# Patient Record
Sex: Female | Born: 1980 | Race: Black or African American | Hispanic: No | Marital: Single | Smoking: Never smoker
Health system: Southern US, Community
[De-identification: ages and names within clinical notes are randomized; demographics above are authoritative.]

## PROBLEM LIST (undated history)

## (undated) DIAGNOSIS — E063 Autoimmune thyroiditis: Secondary | ICD-10-CM

## (undated) DIAGNOSIS — K219 Gastro-esophageal reflux disease without esophagitis: Secondary | ICD-10-CM

## (undated) DIAGNOSIS — R7303 Prediabetes: Secondary | ICD-10-CM

## (undated) DIAGNOSIS — E669 Obesity, unspecified: Secondary | ICD-10-CM

## (undated) HISTORY — DX: Gastro-esophageal reflux disease without esophagitis: K21.9

## (undated) HISTORY — DX: Autoimmune thyroiditis: E06.3

## (undated) HISTORY — DX: Prediabetes: R73.03

## (undated) HISTORY — DX: Obesity, unspecified: E66.9

---

## 1999-02-09 ENCOUNTER — Encounter: Admission: RE | Admit: 1999-02-09 | Discharge: 1999-02-09 | Payer: Self-pay | Admitting: Orthopedic Surgery

## 2001-01-18 ENCOUNTER — Emergency Department (HOSPITAL_COMMUNITY): Admission: EM | Admit: 2001-01-18 | Discharge: 2001-01-18 | Payer: Self-pay | Admitting: Emergency Medicine

## 2002-05-21 ENCOUNTER — Ambulatory Visit (HOSPITAL_COMMUNITY): Admission: RE | Admit: 2002-05-21 | Discharge: 2002-05-21 | Payer: Self-pay | Admitting: Obstetrics and Gynecology

## 2002-05-21 ENCOUNTER — Encounter: Payer: Self-pay | Admitting: Obstetrics and Gynecology

## 2002-09-05 ENCOUNTER — Ambulatory Visit (HOSPITAL_COMMUNITY): Admission: RE | Admit: 2002-09-05 | Discharge: 2002-09-05 | Payer: Self-pay | Admitting: Gastroenterology

## 2005-05-24 ENCOUNTER — Other Ambulatory Visit: Admission: RE | Admit: 2005-05-24 | Discharge: 2005-05-24 | Payer: Self-pay | Admitting: Obstetrics and Gynecology

## 2006-07-25 ENCOUNTER — Inpatient Hospital Stay (HOSPITAL_COMMUNITY): Admission: AD | Admit: 2006-07-25 | Discharge: 2006-07-25 | Payer: Self-pay | Admitting: Obstetrics and Gynecology

## 2007-04-13 HISTORY — PX: CHOLECYSTECTOMY: SHX55

## 2007-08-02 ENCOUNTER — Emergency Department (HOSPITAL_COMMUNITY): Admission: EM | Admit: 2007-08-02 | Discharge: 2007-08-02 | Payer: Self-pay | Admitting: Emergency Medicine

## 2007-09-06 ENCOUNTER — Encounter: Admission: RE | Admit: 2007-09-06 | Discharge: 2007-09-06 | Payer: Self-pay | Admitting: Family Medicine

## 2007-10-30 ENCOUNTER — Ambulatory Visit (HOSPITAL_COMMUNITY): Admission: RE | Admit: 2007-10-30 | Discharge: 2007-10-30 | Payer: Self-pay | Admitting: General Surgery

## 2007-10-30 ENCOUNTER — Encounter (INDEPENDENT_AMBULATORY_CARE_PROVIDER_SITE_OTHER): Payer: Self-pay | Admitting: General Surgery

## 2010-04-12 HISTORY — PX: TONSILLECTOMY: SUR1361

## 2010-08-25 NOTE — Op Note (Signed)
Kristina Khan, Kristina Khan NO.:  [id number]   MEDICAL RECORD NO.:  [id number]          PATIENT TYPE:  AMB   LOCATION:  DAY                          FACILITY:  [facility]   PHYSICIAN:  Anselm Pancoast. Weatherly, M.D.DATE OF BIRTH:  01-06-1981   DATE OF PROCEDURE:  10/30/2007  DATE OF DISCHARGE:                               OPERATIVE REPORT   PREOPERATIVE DIAGNOSIS:  Symptomatic cholelithiasis.   POSTOPERATIVE DIAGNOSIS:  Symptomatic cholelithiasis.   PROCEDURE PERFORMED:  Laparoscopic cholecystectomy.   SURGEON:  Juanetta Gosling, MD   ASSISTANT:  Anselm Pancoast. Zachery Dakins, M.D.   ANESTHESIA:  General.   ANESTHETIST:  Joyce, CRNA.   ESTIMATED BLOOD LOSS:  Minimal.   FLUIDS:  600 mL normal saline.   SPECIMENS:  Gallbladder with contents to pathology.   COMPLICATIONS:  None.   DRAINS:  None.   INDICATIONS:  This is a 30 year old female who has had several months of  intermittent right upper quadrant pain associated with eating.  Also  associated with some intermittent nausea and vomiting.  She underwent an  ultrasound which showed gallstones.  She has no evidence of prior  jaundice and her LFTs were normal preoperatively.  We discussed a  laparoscopic cholecystectomy for symptomatic cholelithiasis.   PROCEDURE IN DETAIL:  After informed consent is obtained, the patient  was taken to the operating room where she was placed under general  anesthesia without complications.  She was administered antibiotics  intravenously prior to beginning the procedure.  Pneumatic stockings  were placed on her bilateral lower extremities during this procedure as  well.  Her abdomen was then prepped and draped in a standard, sterile  surgical fashion.  A vertical 10 mm incision was then made  infraumbilically and dissection was carried out down to the level of her  umbilical stalk.  This was then entered sharply without complication.  A  Hassan trocar was then inserted after a  pursestring suture was placed.  The abdomen was then insufflated without complication.  A 5 mm port was  then placed in the epigastrium.  Two further 5 mm ports were placed  under direct vision in the right upper quadrant after infiltration with  local anesthetic.   The patient was then placed in reverse Trendelenburg position.  The  gallbladder was then grasped,  There were a small amount of adhesions to  the liver that were taken down easily.  The triangle of Calot was then  dissected with identification of the cystic duct and the cystic artery  very easily.  The critical view of safety was then obtained.  I then  placed 3 clips on the cystic duct and cut the cystic duct leaving 2  clips in place.  Then 3 clips were then placed on the cystic artery and  this was then cut as well, leaving 2 in place.  The gallbladder was then  removed from the liver bed using cautery with no evidence of any  bleeding or bile spillage during this portion of the procedure.  An  EndoCatch was then placed in the umbilical port and a 5 mm camera was  placed in the epigastrium.  The gallbladder was then placed in the  EndoCatch bag and removed through the umbilicus without difficulty.  The  liver bed had observed to be hemostatic with the clips in good position  also.  The pursestring suture was then tied at the belly button.  All  other ports were then removed after removing all the CO2 gas.  The belly  button wound was then closed with a 4-0 Monocryl suture and Steri-Strips  and a dressing were applied.  The 3 other wounds were then approximated  with a Monocryl and then closed with Dermabond.  She tolerated this  procedure well, was extubated in the operating room and transferred to  the PACU in stable condition.      Juanetta Gosling, MD  Electronically Signed     ______________________________  Anselm Pancoast. Zachery Dakins, M.D.    MCW/MEDQ  D:  10/30/2007  T:  10/30/2007  Job:  [id number]

## 2010-08-28 NOTE — Op Note (Signed)
   NAME:  Kristina Khan, Kristina Khan                      ACCOUNT NO.:  [id number]   MEDICAL RECORD NO.:  [id number]                   PATIENT TYPE:  AMB   LOCATION:  ENDO                                 FACILITY:  [facility]   PHYSICIAN:  Anselmo Rod, M.D.               DATE OF BIRTH:  1981-01-10   DATE OF PROCEDURE:  09/05/2002  DATE OF DISCHARGE:                                 OPERATIVE REPORT   PROCEDURE PERFORMED:  Screening  colonoscopy.   ENDOSCOPIST:  Charna Elizabeth, M.D.   INSTRUMENT USED:  Olympus video colonoscope.   INDICATIONS FOR PROCEDURE:  The patient is a 30 year old African-American  female with a history of bright red blood per rectum and iron deficiency  anemia.  Hemoglobin down to 9.6g/dl.  Rule out colonic polyps, masses, etc.   PREPROCEDURE PREPARATION:  Informed consent was procured from the patient.  The patient was fasted for eight hours prior to the procedure and prepped  with a bottle of magnesium citrate and a gallon of GoLYTELY the night prior  to the procedure.   PREPROCEDURE PHYSICAL:  The patient had stable vital signs.  Neck supple.  Chest clear to auscultation.  S1 and S2 regular.  Abdomen soft with normal  bowel sounds.   DESCRIPTION OF PROCEDURE:  The patient was placed in left lateral decubitus  position and sedated with Demerol and Versed for the EGD.  An additional 20  mg of Demerol and 2 mg of Versed was used for the colonoscopy.  Once the  patient was adequately sedated and maintained on low flow oxygen and  continuous cardiac monitoring, the Olympus video colonoscope was advanced  into the rectum, cecum and terminal ileum without difficulty.  No masses,  polyps, erosions, ulcerations or diverticula were seen and retroflexion in  the rectum revealed no abnormalities, no source of bleeding could be  identified.   IMPRESSION:  Normal colonoscopy including the terminal ileum.   RECOMMENDATIONS:  1. Proceed with gynecological evaluation to rule out  source of uterine blood     loss.  2. Continue iron supplements.  3. Outpatient follow-up in the next two weeks for further recommendation.                                              Anselmo Rod, M.D.   JNM/MEDQ  D:  09/05/2002  T:  09/05/2002  Job:  [id number]   cc:   Gabriel Earing, M.D.  [address]  [place]  [place] [address]  Fax: [phone]   Hal Morales, M.D.  [address], [address]  [place]  [place] [address]  Fax: [phone]

## 2010-08-28 NOTE — Op Note (Signed)
   NAME:  Kristina Khan, Kristina Khan                      ACCOUNT NO.:  [id number]   MEDICAL RECORD NO.:  [id number]                   PATIENT TYPE:  AMB   LOCATION:  ENDO                                 FACILITY:  [facility]   PHYSICIAN:  Anselmo Rod, M.D.               DATE OF BIRTH:  02-03-1981   DATE OF PROCEDURE:  09/05/2002  DATE OF DISCHARGE:                                 OPERATIVE REPORT   PROCEDURE:  Esophagogastroduodenoscopy.   ENDOSCOPIST:  Anselmo Rod, M.D.   INSTRUMENT USED:  Olympus video panendoscope.   INDICATION FOR PROCEDURE:  A 30 year old African-American female with a  history of iron-deficiency anemia and BRBPR.  Hemoglobin down to 9.6 g/dl.  Rule out peptic ulcer disease, esophagitis, gastritis, etc.   PREPROCEDURE PREPARATION:  Informed consent was procured from the patient.  The patient was fasted for eight hours prior to the procedure.   PREPROCEDURE PHYSICAL:  VITAL SIGNS:  The patient had stable vital signs.  NECK:  Supple.  CHEST:  Clear to auscultation.  S1, S2 regular.  ABDOMEN:  Soft with normal bowel sounds.   DESCRIPTION OF PROCEDURE:  The patient was placed in the left lateral  decubitus position and sedated with 100 mg of Demerol and 10 mg of Versed  intravenously.  Once the patient was adequately sedate and maintained on low-  flow oxygen and continuous cardiac monitoring, the Olympus video  panendoscope was advanced through the mouthpiece, over the tongue, into the  esophagus under direct vision.  The entire esophagus appeared normal with no  evidence of ring, stricture, mass, esophagitis, or Barrett's mucosa.  The  scope was then advanced in the stomach.  The entire gastric mucosa and  proximal small bowel appeared normal.   IMPRESSION:  Normal EGD.   RECOMMENDATIONS:  Proceed with a colonoscopy at this time.                                                Anselmo Rod, M.D.    JNM/MEDQ  D:  09/05/2002  T:  09/05/2002  Job:   [id number]   cc:   Gabriel Earing, M.D.  [address]  [place]  [place] [address]  Fax: [phone]   Hal Morales, M.D.  [address]  [place]  [place] [address]  Fax: [phone]

## 2011-01-08 LAB — COMPREHENSIVE METABOLIC PANEL
BUN: 12
CO2: 26
Calcium: 9.1
Chloride: 104
Creatinine, Ser: 0.74
GFR calc non Af Amer: >60
Glucose, Bld: 93
Total Bilirubin: 0.5

## 2011-01-08 LAB — DIFFERENTIAL
Basophils Absolute: 0
Eosinophils Relative: 1
Lymphocytes Relative: 24
Neutro Abs: 7.9 — ABNORMAL HIGH
Neutrophils Relative %: 69

## 2011-01-08 LAB — CBC
HCT: 39.5
MCHC: 32
MCV: 77.9 — ABNORMAL LOW
RBC: 5.07
WBC: 11.5 — ABNORMAL HIGH

## 2011-01-08 LAB — PREGNANCY, URINE: Preg Test, Ur: NEGATIVE

## 2015-04-13 HISTORY — PX: HIATAL HERNIA REPAIR: SHX195

## 2015-04-13 HISTORY — PX: LAPAROSCOPIC GASTRIC SLEEVE RESECTION: SHX5895

## 2019-05-11 ENCOUNTER — Other Ambulatory Visit: Payer: Self-pay | Admitting: Urgent Care

## 2019-05-11 DIAGNOSIS — E063 Autoimmune thyroiditis: Secondary | ICD-10-CM

## 2019-05-14 ENCOUNTER — Ambulatory Visit
Admission: RE | Admit: 2019-05-14 | Discharge: 2019-05-14 | Disposition: A | Discharge status: Home / Self Care | Payer: BC Managed Care – PPO | Source: Ambulatory Visit | Attending: Urgent Care | Admitting: Urgent Care

## 2019-05-14 DIAGNOSIS — E063 Autoimmune thyroiditis: Secondary | ICD-10-CM

## 2019-05-17 ENCOUNTER — Other Ambulatory Visit: Payer: Self-pay | Admitting: Urgent Care

## 2019-06-19 ENCOUNTER — Other Ambulatory Visit: Payer: Self-pay

## 2019-06-21 ENCOUNTER — Ambulatory Visit: Payer: BC Managed Care – PPO | Admitting: Internal Medicine

## 2019-06-21 ENCOUNTER — Encounter: Payer: Self-pay | Admitting: Internal Medicine

## 2019-06-21 ENCOUNTER — Other Ambulatory Visit: Payer: Self-pay

## 2019-06-21 VITALS — BP 128/80 | HR 90 | Ht 59.5 in | Wt 265.0 lb

## 2019-06-21 DIAGNOSIS — E063 Autoimmune thyroiditis: Secondary | ICD-10-CM

## 2019-06-21 DIAGNOSIS — E041 Nontoxic single thyroid nodule: Secondary | ICD-10-CM

## 2019-06-21 NOTE — Patient Instructions (Addendum)
Please stop at the lab.  Please start: - Selenium 200 mcg daily  We will schedule a thyroid biopsy for you.  Please come back for a follow-up appointment in 6 months. Please stop the Biotin few days before the next visit.   Thyroid Nodule  A thyroid nodule is an isolated growth of thyroid cells that forms a lump in your thyroid gland. The thyroid gland is a butterfly-shaped gland. It is found in the lower front of your neck. This gland sends chemical messengers (hormones) through your blood to all parts of your body. These hormones are important in regulating your body temperature and helping your body to use energy. Thyroid nodules are common. Most are not cancerous (benign). You may have one nodule or several nodules. Different types of thyroid nodules include nodules that:  Grow and fill with fluid (thyroid cysts).  Produce too much thyroid hormone (hot nodules or hyperthyroid).  Produce no thyroid hormone (cold nodules or hypothyroid).  Form from cancer cells (thyroid cancers). What are the causes? In most cases, the cause of this condition is not known. What increases the risk? The following factors may make you more likely to develop this condition.  Age. Thyroid nodules become more common in people who are older than 41 years of age.  Gender. ? Benign thyroid nodules are more common in women. ? Cancerous (malignant) thyroid nodules are more common in men.  A family history that includes: ? Thyroid nodules. ? Pheochromocytoma. ? Thyroid carcinoma. ? Hyperparathyroidism.  Certain kinds of thyroid diseases, such as Hashimoto's thyroiditis.  Lack of iodine in your diet.  A history of head and neck radiation, such as from previous cancer treatment. What are the signs or symptoms? In many cases, there are no symptoms. If you have symptoms, they may include:  A lump in your lower neck.  Feeling a lump or tickle in your throat.  Pain in your neck, jaw, or  ear.  Having trouble swallowing. Hot nodules may cause symptoms that include:  Weight loss.  Warm, flushed skin.  Feeling hot.  Feeling nervous.  A racing heartbeat. Cold nodules may cause symptoms that include:  Weight gain.  Dry skin.  Brittle hair. This may also occur with hair loss.  Feeling cold.  Fatigue. Thyroid cancer nodules may cause symptoms that include:  Hard nodules that feel stuck to the thyroid gland.  Hoarseness.  Lumps in the glands near your thyroid (lymph nodes). How is this diagnosed? A thyroid nodule may be felt by your health care provider during a physical exam. This condition may also be diagnosed based on your symptoms. You may also have tests, including:  An ultrasound. This may be done to confirm the diagnosis.  A biopsy. This involves taking a sample from the nodule and looking at it under a microscope.  Blood tests to make sure that your thyroid is working properly.  A thyroid scan. This test uses a radioactive tracer injected into a vein to create an image of the thyroid gland on a computer screen.  Imaging tests such as MRI or CT scan. These may be done if: ? Your nodule is large. ? Your nodule is blocking your airway. ? Cancer is suspected. How is this treated? Treatment depends on the cause and size of your nodule or nodules. If the nodule is benign, treatment may not be necessary. Your health care provider may monitor the nodule to see if it goes away without treatment. If the nodule continues to grow,  is cancerous, or does not go away, treatment may be needed. Treatment may include:  Having a cystic nodule drained with a needle.  Ablation therapy. In this treatment, alcohol is injected into the area of the nodule to destroy the cells. Ablation with heat (thermal ablation) may also be used.  Radioactive iodine. In this treatment, radioactive iodine is given as a pill or liquid that you drink. This substance causes the thyroid  nodule to shrink.  Surgery to remove the nodule. Part or all of your thyroid gland may need to be removed as well.  Medicines. Follow these instructions at home:  Pay attention to any changes in your nodule.  Take over-the-counter and prescription medicines only as told by your health care provider.  Keep all follow-up visits as told by your health care provider. This is important. Contact a health care provider if:  Your voice changes.  You have trouble swallowing.  You have pain in your neck, ear, or jaw that is getting worse.  Your nodule gets bigger.  Your nodule starts to make it harder for you to breathe.  Your muscles look like they are shrinking (muscle wasting). Get help right away if:  You have chest pain.  There is a loss of consciousness.  You have a sudden fever.  You feel confused.  You are seeing or hearing things that other people do not see or hear (having hallucinations).  You feel very weak.  You have mood swings.  You feel very restless.  You feel suddenly nauseous or throw up.  You suddenly have diarrhea. Summary  A thyroid nodule is an isolated growth of thyroid cells that forms a lump in your thyroid gland.  Thyroid nodules are common. Most are not cancerous (benign). You may have one nodule or several nodules.  Treatment depends on the cause and size of your nodule or nodules. If the nodule is benign, treatment may not be necessary.  Your health care provider may monitor the nodule to see if it goes away without treatment. If the nodule continues to grow, is cancerous, or does not go away, treatment may be needed. This information is not intended to replace advice given to you by your health care provider. Make sure you discuss any questions you have with your health care provider. Document Revised: 11/11/2017 Document Reviewed: 11/14/2017 Elsevier Patient Education  2020 [employer].   Thyroid Needle Biopsy  Thyroid needle  biopsy is a procedure to remove small samples of tissue or fluid from the thyroid gland. The samples are then examined under a microscope. The thyroid is a gland in the lower front area of the neck. It produces hormones that affect many important body processes, including growth and development, body temperature, and how the body uses food for energy (metabolism). This procedure is often done to help diagnose cancer, infection, or other problems with the thyroid. During this procedure, a thin needle (fine needle) is inserted through the skin and into the thyroid gland. This is less invasive than a procedure in which an incision is made over the thyroid (open thyroid biopsy). Sometimes, an open thyroid biopsy may be done during a different surgery, such as surgery to remove a part or a whole section (lobe) of the thyroid gland (open lobectomy). Tell a health care provider about:  Any allergies you have.  All medicines you are taking, including vitamins, herbs, eye drops, creams, and over-the-counter medicines.  Any problems you or family members have had with anesthetic medicines.  Any blood disorders you have.  Any surgeries you have had.  Any medical conditions you have.  Whether you are pregnant or may be pregnant. What are the risks? Generally, this is a safe procedure. However, problems may occur, including:  Infection.  Bleeding.  Allergic reactions to medicines.  Damage to nerves or blood vessels in the neck. What happens before the procedure? Medicines  Ask your health care provider about: ? Changing or stopping your regular medicines. This is especially important if you are taking diabetes medicines or blood thinners. ? Taking medicines such as aspirin and ibuprofen. These medicines can thin your blood. Do not take these medicines unless your health care provider tells you to take them. ? Taking over-the-counter medicines, vitamins, herbs, and supplements. General  instructions  You may have blood tests.  You may have an ultrasound before or during the needle biopsy. What happens during the procedure?  You will be asked to lie on your back with your head tipped backward to extend your neck. You may be asked to avoid coughing, talking, swallowing, or making sounds during some parts of the procedure.  To lower your risk of infection: ? Your health care team will wash or sanitize their hands. ? The skin over your thyroid will be cleaned with a germ-killing (antiseptic) solution.  A local anesthetic (lidocaine) may be injected into the skin over your thyroid, to numb the area.  An ultrasound may be done to help guide the needle to the desired area of your thyroid.  A fine needle will be inserted into your thyroid. The needle will be used to remove tissue or fluid samples as needed. The samples will be sent to a lab for examination.  The needle will be removed.  Pressure may be applied to your neck to reduce swelling and stop bleeding. The procedure may vary among health care providers and hospitals. What happens after the procedure?  It is up to you to get the results of your procedure. Ask your health care provider, or the department that is doing the procedure, when your results will be ready. Summary  Thyroid needle biopsy is a procedure to remove small samples of tissue or fluid from the thyroid gland.  During this procedure, a thin needle (fine needle) is inserted through the skin and into the thyroid gland. This is less invasive than a procedure in which an incision is made over the thyroid (open thyroid biopsy).  You will be asked to lie on your back with your head tipped backward to extend your neck. You may be asked to avoid coughing, talking, swallowing, or making sounds during some parts of the procedure. This information is not intended to replace advice given to you by your health care provider. Make sure you discuss any questions you  have with your health care provider. Document Revised: 03/11/2017 Document Reviewed: 01/10/2017 Elsevier Patient Education  2020 [employer].

## 2019-06-21 NOTE — Progress Notes (Signed)
Patient ID: Kristina Khan, female   DOB: Dec 26, 1980, 39 y.o.   MRN: [id number]   This visit occurred during the SARS-CoV-2 public health emergency.  Safety protocols were in place, including screening questions prior to the visit, additional usage of staff PPE, and extensive cleaning of exam room while observing appropriate contact time as indicated for disinfecting solutions.   HPI  Kristina Khan is a 39 y.o.-year-old female, referred by her PCP, Blandford, Jannette Fogo, NP ([facility]) for management of thyroid nodule and Hashimoto's thyroiditis. She moved here from [place], [place], in 10/2018.  Pt. has been dx with a thyroid nodule in 04/2019 after she presented at [facility] for Weight loss. TFTs were normal, but thyroid antibodies were high.  She established care with a PCP afterwards who palpated a thyroid nodule >> sent for a thyroid U/S.  Retrospectively, she remembers her PCP in [place] mentioning that she had a goiter in the past.  Thyroid ultrasound (05/14/2019): Heterogeneous thyroid with a solid, hypoechoic, left mid lobe thyroid nodule measuring 1.5 x 0.8 x 0.5 cm.  By the current guidelines, a recommendation was made for biopsy.    She also was found to have Hashimoto's thyroiditis in 04/2019; she is not on Levothyroxine.  I reviewed pt's thyroid tests: 04/28/2019: TSH 1.604, fT4 1.02 (0.7-1.48), fT3 2.1 (2-4.1), TPO antibody 713 (0-6) No results found for: TSH, FREET4   Pt describes: - weight gain - fatigue - cold intolerance - depression - constipation - dry skin - hair loss  Pt denies feeling nodules in neck, but has hoarseness and pressure in L neck, dysphagia/odynophagia, SOB with lying down.  She has + FH of thyroid disorders in: MGM (goiter), M aunt. No FH of thyroid cancer. No FH of autoimmune ds. No h/o radiation tx to head or neck. No recent use of iodine supplements. On Biotin 1000 mcg.  Pt. also has a history of gastric sleeve surgery 2017.  Her weight  decreased to 212 pounds, but since then, she gained a significant amount of back.  On prn Neurontin for neuropathy.  Also, on Contrave, now Naltrexone.  On Ergocalciferol, 50,000 units weekly.  ROS: Constitutional: no weight gain/loss, no fatigue, no subjective hyperthermia/hypothermia Eyes: no blurry vision, no xerophthalmia ENT: no sore throat, no nodules palpated in throat, no dysphagia/odynophagia, no hoarseness Cardiovascular: no CP/SOB/palpitations/leg swelling Respiratory: no cough/SOB Gastrointestinal: no N/V/D/C, + acid reflux Musculoskeletal: no muscle/joint aches Skin: no rashes Neurological: no tremors/numbness/tingling/dizziness Psychiatric: no depression/anxiety  No past medical history on file.  Social History   Socioeconomic History  . Marital status: Single    Spouse name: Not on file  . Number of children: Not on file  . Years of education: Not on file  . Highest education level: Not on file  Occupational History  . Not on file  Tobacco Use  . Smoking status: Never Smoker  . Smokeless tobacco: Never Used  Substance and Sexual Activity  . Alcohol use: Not on file  . Drug use: Not on file  . Sexual activity: Not on file  Other Topics Concern  . Not on file  Social History Narrative  . Not on file   Social Determinants of Health   Financial Resource Strain:   . Difficulty of Paying Living Expenses:   Food Insecurity:   . Worried About Charity fundraiser in the Last Year:   . Arboriculturist in the Last Year:   Transportation Needs:   . Film/video editor (Medical):   Marland Kitchen  Lack of Transportation (Non-Medical):   Physical Activity:   . Days of Exercise per Week:   . Minutes of Exercise per Session:   Stress:   . Feeling of Stress :   Social Connections:   . Frequency of Communication with Friends and Family:   . Frequency of Social Gatherings with Friends and Family:   . Attends Religious Services:   . Active Member of Clubs or  Organizations:   . Attends Archivist Meetings:   Marland Kitchen Marital Status:   Intimate Partner Violence:   . Fear of Current or Ex-Partner:   . Emotionally Abused:   Marland Kitchen Physically Abused:   . Sexually Abused:    Current Outpatient Medications on File Prior to Visit  Medication Sig Dispense Refill  . esomeprazole (NEXIUM) 40 MG capsule Take 40 mg by mouth daily.    Marland Kitchen gabapentin (NEURONTIN) 300 MG capsule Take 300 mg by mouth daily.     No current facility-administered medications on file prior to visit.   Allergies  Allergen Reactions  . Shellfish Allergy Anaphylaxis  . Penicillins Rash   No family history on file.   PE: BP 128/80   Pulse 90   Ht 4' 11.5" (1.511 m) Comment: measured without shoes  Wt 265 lb (120.2 kg)   SpO2 98%   BMI 52.63 kg/m  Wt Readings from Last 3 Encounters:  06/21/19 265 lb (120.2 kg)   Constitutional: overweight, in NAD Eyes: PERRLA, EOMI, no exophthalmos ENT: moist mucous membranes, no thyromegaly, no cervical lymphadenopathy Cardiovascular: RRR, No MRG Respiratory: CTA B Gastrointestinal: abdomen soft, NT, ND, BS+ Musculoskeletal: no deformities, strength intact in all 4 Skin: moist, warm, no rashes Neurological: no tremor with outstretched hands, DTR normal in all 4  ASSESSMENT: 1. L thyroid nodule  2. Hashimoto thyroiditis  PLAN: 1.  Left thyroid nodule - I reviewed the images of her thyroid ultrasound along with the patient. I pointed out that the dominant nodule is not large.,  However, it is hypoechoic, which increases the risk for cancer. Otherwise, the nodule is: - without microcalcifications - without internal blood flow - more wide than tall - well delimited from surrounding tissue Pt does not have a thyroid cancer family history or a personal history of RxTx to head/neck. All these would favor benignity.  - the only way that we can tell exactly if it is cancer or not is by doing a thyroid biopsy (FNA). I explained what  the test entails. - patient decided to have the FNA done now >> I ordered this.  - I explained that this is not cancer, we can continue to follow her on a yearly basis, and check another ultrasound in another year or 2. - she should let me know if she develops neck compression symptoms, in that case, we might need to do either lobectomy or thyroidectomy - I did explain that, while thyroid surgery is not a complicated one, it still can have side effects and also she might have a risk of ~25% of becoming hypothyroid after hemithyroidectomy.  - I'll see her back in a year, assuming her FNA is normal. If FNA abnormal, we will meet sooner.  - I advised pt to join my chart and I will send her the results through there   2. Hashimoto thyroiditis - I reviewed the images of her thyroid ultrasound along with the patient. I pointed out that her thyroid gland appears heterogeneous, with a "moth-eaten" aspect, indicative of Hashimoto's thyroiditis.  Pt  does not have a thyroid cancer family history or a personal history of RxTx to head/neck, so is not at high risk for [facility]. - we had a long discussion about her Hashimoto thyroiditis diagnosis. I explained that this is an autoimmune disorder, in which she develops antibodies against her own thyroid. The antibodies bind to the thyroid tissue and cause inflammation, and, eventually, destruction of the gland and hypothyroidism. We don't know how long this process can be, it can last from months to years. As of now, based on the last results that I have, her thyroid tests are normal. We will repeat them today, however. I will also add thyroid antibody levels. - I also explained that thyroid enlargement especially at the beginning of her Hashimoto thyroiditis course is not uncommon, and it has a waxing and waning character.  - We discussed about treatment for Hashimoto thyroiditis, which is actually limited to thyroid hormones in case her TFTs are abnormal. Supplements like  selenium has been tried with various results, some showing improvement in the TPO antibodies. However, there are no randomized controlled trials of this are consistent results between trials. We also discussed about ways to improve her immune system (relaxation, diet, exercise, sleep) to reduce the Ab titer and, subsequently, the thyroid inflammation. - We decided to check thyroid tests now and have her return in a year for repeat. However, she should let me know if she develops more neck compression symptoms, in that case, we might need to repeat a thyroid U/S or a Ba swallow test  CYTOLOGY - NON PAP  CASE: [id number]  PATIENT: Kristina Khan  Non-Gynecological Cytology Report   Clinical History: None provided  Specimen Submitted: A. THYROID, LEFT, FINE NEEDLE ASPIRATION:    FINAL MICROSCOPIC DIAGNOSIS:  - Consistent with lymphocytic (Hashimoto) thyroiditis in the proper  clinical context (Bethesda category II)   SPECIMEN ADEQUACY:  Satisfactory for evaluation    Philemon Kingdom, MD PhD [facility] Endocrinology

## 2019-06-27 ENCOUNTER — Ambulatory Visit
Admission: RE | Admit: 2019-06-27 | Discharge: 2019-06-27 | Disposition: A | Discharge status: Home / Self Care | Payer: BC Managed Care – PPO | Source: Ambulatory Visit | Attending: Internal Medicine | Admitting: Internal Medicine

## 2019-06-27 ENCOUNTER — Other Ambulatory Visit (HOSPITAL_COMMUNITY)
Admission: RE | Admit: 2019-06-27 | Discharge: 2019-06-27 | Disposition: A | Discharge status: Home / Self Care | Payer: BC Managed Care – PPO | Source: Ambulatory Visit | Attending: Physician Assistant | Admitting: Physician Assistant

## 2019-06-27 DIAGNOSIS — E041 Nontoxic single thyroid nodule: Secondary | ICD-10-CM

## 2019-06-27 NOTE — Procedures (Signed)
PROCEDURE SUMMARY:  Using direct ultrasound guidance, 5 passes were made using 25 g needles into the nodule within the left lobe of the thyroid.   Ultrasound was used to confirm needle placements on all occasions.   EBL = trace  Specimens were sent to Pathology for analysis.  See procedure note under Imaging tab in Epic for full procedure details.  Curtistine Pettitt S Shantae Vantol PA-C 06/27/2019 9:24 AM

## 2019-06-29 ENCOUNTER — Telehealth: Payer: Self-pay

## 2019-06-29 ENCOUNTER — Encounter: Payer: Self-pay | Admitting: Internal Medicine

## 2019-06-29 LAB — CYTOLOGY - NON PAP

## 2019-06-29 NOTE — Telephone Encounter (Signed)
Spoke to pathology and it was not sent the report was an error and they are updating it.

## 2019-06-29 NOTE — Telephone Encounter (Signed)
-----   Message from Philemon Kingdom, MD sent at 06/29/2019 12:15 PM EDT ----- M, can you please call the cytology department and see if the Afirma test was actually sent and if so, why, since the  biopsy is not inconclusive.Marland KitchenMarland Kitchen

## 2019-06-29 NOTE — Telephone Encounter (Signed)
Great! Thank you! I let the pt know it was benign.

## 2019-12-24 ENCOUNTER — Encounter: Payer: Self-pay | Admitting: Internal Medicine

## 2019-12-24 ENCOUNTER — Other Ambulatory Visit (INDEPENDENT_AMBULATORY_CARE_PROVIDER_SITE_OTHER): Payer: BC Managed Care – PPO

## 2019-12-24 ENCOUNTER — Ambulatory Visit (INDEPENDENT_AMBULATORY_CARE_PROVIDER_SITE_OTHER): Payer: BC Managed Care – PPO | Admitting: Internal Medicine

## 2019-12-24 ENCOUNTER — Other Ambulatory Visit: Payer: Self-pay

## 2019-12-24 VITALS — BP 118/70 | HR 88 | Ht 59.5 in | Wt 263.0 lb

## 2019-12-24 DIAGNOSIS — E041 Nontoxic single thyroid nodule: Secondary | ICD-10-CM

## 2019-12-24 DIAGNOSIS — E063 Autoimmune thyroiditis: Secondary | ICD-10-CM | POA: Diagnosis not present

## 2019-12-24 NOTE — Patient Instructions (Addendum)
Please stop at [facility] lab.  Please come back for a follow-up appointment in 1 year.

## 2019-12-24 NOTE — Progress Notes (Signed)
Patient ID: Kristina Khan, female   DOB: 1981/02/13, 39 y.o.   MRN: [id number]   This visit occurred during the SARS-CoV-2 public health emergency.  Safety protocols were in place, including screening questions prior to the visit, additional usage of staff PPE, and extensive cleaning of exam room while observing appropriate contact time as indicated for disinfecting solutions.   HPI  Kristina Khan is a 39 y.o.-year-old very pleasant female, initially referred by her PCP, Elmer Ramp, Whitney L, PA ([facility]), returning for follow-up for a left thyroid nodule and Hashimoto's thyroiditis. She moved here from [place], [place], in 10/2018.  Last visit 6 months ago.  At this visit, she complains of thigh muscle cramps.  They are intense and bilateral.  The pain does not resolve for many days after such cramps.  Reviewed and addended history: Pt. has been dx with a thyroid nodule in 04/2019 after she presented at [facility] for Weight loss. TFTs were normal, but thyroid antibodies were high, pointing towards Hashimoto's thyroiditis.  She is not on levothyroxine.  She established care with a PCP afterwards who palpated a thyroid nodule >> sent for a thyroid U/S.  Retrospectively, she remembers her PCP in [place] mentioning that she had a goiter in the past.  Thyroid ultrasound (05/14/2019): Heterogeneous thyroid with a solid, hypoechoic, left mid lobe thyroid nodule measuring 1.5 x 0.8 x 0.5 cm.  By the current guidelines, a recommendation was made for biopsy.    At last visit, I sent her for an FNA of her left thyroid nodule (06/27/2019): Specimen Submitted: A. THYROID, LEFT, FINE NEEDLE ASPIRATION:   FINAL MICROSCOPIC DIAGNOSIS:  - Consistent with lymphocytic (Hashimoto) thyroiditis in the proper  clinical context (Bethesda category II)   SPECIMEN ADEQUACY:  Satisfactory for evaluation   Reviewed her TFTs: 04/28/2019: TSH 1.604, fT4 1.02 (0.7-1.48), fT3 2.1 (2-4.1), TPO antibody 713 (0-6) No  results found for: TSH, FREET4   Pt denies: - feeling nodules in neck - hoarseness - dysphagia - choking - SOB with lying down At last visit, she had some pressure in the left neck, resolved now.  She has + FH of thyroid disorders in: MGM (goiter), M aunt. No FH of thyroid cancer. No h/o radiation tx to head or neck.  No herbal supplements. + Biotin use (1000 mcg daily) - off for 3 weeks. No recent steroids use.   She has a history of gastric sleeve surgery in 2017.  Her weight decreased to 212 pounds, but since then, she gained a significant amount of back.  She takes as needed Neurontin for neuropathy.  Also prev. on naltrexone >> now off.  She takes ergocalciferol 50,000 units weekly.  ROS: Constitutional: no weight gain/no weight loss, no fatigue, no subjective hyperthermia, no subjective hypothermia Eyes: no blurry vision, no xerophthalmia ENT: no sore throat, no nodules palpated in neck, no dysphagia, no odynophagia, no hoarseness Cardiovascular: no CP/no SOB/no palpitations/no leg swelling Respiratory: no cough/no SOB/no wheezing Gastrointestinal: no N/no V/no D/no C/+ acid reflux Musculoskeletal: + muscle cramps/no joint aches Skin: no rashes, no hair loss Neurological: no tremors/no numbness/no tingling/no dizziness  I reviewed pt's medications, allergies, PMH, social hx, family hx, and changes were documented in the history of present illness. Otherwise, unchanged from my initial visit note.  No past medical history on file.  Social History   Socioeconomic History  . Marital status: Single    Spouse name: Not on file  . Number of children: Not on file  . Years  of education: Not on file  . Highest education level: Not on file  Occupational History  . Not on file  Tobacco Use  . Smoking status: Never Smoker  . Smokeless tobacco: Never Used  Substance and Sexual Activity  . Alcohol use: Not on file  . Drug use: Not on file  . Sexual activity: Not on file    Other Topics Concern  . Not on file  Social History Narrative  . Not on file   Social Determinants of Health   Financial Resource Strain:   . Difficulty of Paying Living Expenses: Not on file  Food Insecurity:   . Worried About Charity fundraiser in the Last Year: Not on file  . Ran Out of Food in the Last Year: Not on file  Transportation Needs:   . Lack of Transportation (Medical): Not on file  . Lack of Transportation (Non-Medical): Not on file  Physical Activity:   . Days of Exercise per Week: Not on file  . Minutes of Exercise per Session: Not on file  Stress:   . Feeling of Stress : Not on file  Social Connections:   . Frequency of Communication with Friends and Family: Not on file  . Frequency of Social Gatherings with Friends and Family: Not on file  . Attends Religious Services: Not on file  . Active Member of Clubs or Organizations: Not on file  . Attends Archivist Meetings: Not on file  . Marital Status: Not on file  Intimate Partner Violence:   . Fear of Current or Ex-Partner: Not on file  . Emotionally Abused: Not on file  . Physically Abused: Not on file  . Sexually Abused: Not on file   Current Outpatient Medications on File Prior to Visit  Medication Sig Dispense Refill  . BIOTIN PO Take by mouth.    . esomeprazole (NEXIUM) 40 MG capsule Take 40 mg by mouth daily.    Marland Kitchen gabapentin (NEURONTIN) 300 MG capsule Take 300 mg by mouth daily.    . Naltrexone-buPROPion HCl (CONTRAVE PO) Take by mouth.     No current facility-administered medications on file prior to visit.   Allergies  Allergen Reactions  . Shellfish Allergy Anaphylaxis  . Penicillins Rash   No family history on file.   PE: BP 118/70   Pulse 88   Ht 4' 11.5" (1.511 m)   Wt 263 lb (119.3 kg)   SpO2 99%   BMI 52.23 kg/m  Wt Readings from Last 3 Encounters:  12/24/19 263 lb (119.3 kg)  06/21/19 265 lb (120.2 kg)   Constitutional: overweight, in NAD Eyes: PERRLA, EOMI, no  exophthalmos ENT: moist mucous membranes, no thyromegaly, no cervical lymphadenopathy Cardiovascular: RRR, No MRG Respiratory: CTA B Gastrointestinal: abdomen soft, NT, ND, BS+ Musculoskeletal: no deformities, strength intact in all 4 Skin: moist, warm, no rashes Neurological: no tremor with outstretched hands, DTR normal in all 4  ASSESSMENT: 1. L thyroid nodule  2. Hashimoto thyroiditis  PLAN: 1.  Left thyroid nodule -I reviewed the images of her thyroid ultrasound.  The dominant thyroid nodule is not large, it does not have microcalcifications, internal blood flow, it is not taller than wide and it does not have irregular margins.  However, it is hypoechoic.  She does not have a high risk of thyroid cancer as she does not have a personal history of radiation therapy to head or neck or a family history of thyroid cancer.  At last visit I suggested  a biopsy of the nodule.  She had this on 06/27/2019 and the results were benign. -She denies neck compression symptoms except for occasional pressure in the left neck -With her thyroid antibodies being elevated, I explained that the most recent cause for the neck pressure is Hashimoto's thyroiditis and not her left thyroid nodule -however, I advised her to let me know if she develops neck compression symptoms, in which case she may need a barium swallow  2. Hashimoto thyroiditis -On ultrasound, her thyroid appeared heterogeneous, indicative of thyroiditis.  -At last visit, we had a long discussion about this and I explained that Hashimoto's thyroiditis is an autoimmune disease, in which she develops antibodies against her own thyroid.  The antibodies bind to the thyroid tissue and cause inflammation and eventually destruction of the gland and hypothyroidism.  This process may take months to years.  As of now, her thyroid tests are intact, so we did not start levothyroxine, but I did suggest to start selenium at last visit mostly for her neck  pressure.  She started the selenium, but then she stopped due to her muscle cramps. -We discussed that muscle cramps are not a feature of Hashimoto's thyroiditis.  I advised him to check with PCP to see if further investigation is needed for these. -At today's visit, I will recheck her TFTs  -I will see her back in 1 year   Component     Latest Ref Rng & Units 12/24/2019  Triiodothyronine,Free,Serum     2.3 - 4.2 pg/mL 3.7  T4,Free(Direct)     0.60 - 1.60 ng/dL 1.08  TSH     0.35 - 4.50 uIU/mL 1.13  Normal TFTs.  Philemon Kingdom, MD PhD [facility] Endocrinology

## 2019-12-25 ENCOUNTER — Encounter: Payer: Self-pay | Admitting: Internal Medicine

## 2019-12-25 LAB — TSH: TSH: 1.13 u[IU]/mL (ref 0.35–4.50)

## 2019-12-25 LAB — T3, FREE: T3, Free: 3.7 pg/mL (ref 2.3–4.2)

## 2019-12-25 LAB — T4, FREE: Free T4: 1.08 ng/dL (ref 0.60–1.60)

## 2020-05-26 DIAGNOSIS — J302 Other seasonal allergic rhinitis: Secondary | ICD-10-CM | POA: Insufficient documentation

## 2020-05-26 DIAGNOSIS — H6983 Other specified disorders of Eustachian tube, bilateral: Secondary | ICD-10-CM | POA: Insufficient documentation

## 2020-07-21 ENCOUNTER — Other Ambulatory Visit: Payer: Self-pay | Admitting: Urgent Care

## 2020-07-21 DIAGNOSIS — Z1231 Encounter for screening mammogram for malignant neoplasm of breast: Secondary | ICD-10-CM

## 2020-07-23 DIAGNOSIS — K219 Gastro-esophageal reflux disease without esophagitis: Secondary | ICD-10-CM | POA: Insufficient documentation

## 2020-07-23 DIAGNOSIS — Z903 Acquired absence of stomach [part of]: Secondary | ICD-10-CM | POA: Insufficient documentation

## 2020-07-23 DIAGNOSIS — E785 Hyperlipidemia, unspecified: Secondary | ICD-10-CM | POA: Insufficient documentation

## 2020-07-23 DIAGNOSIS — G629 Polyneuropathy, unspecified: Secondary | ICD-10-CM | POA: Insufficient documentation

## 2020-07-23 DIAGNOSIS — E063 Autoimmune thyroiditis: Secondary | ICD-10-CM | POA: Insufficient documentation

## 2020-09-17 ENCOUNTER — Ambulatory Visit: Payer: BC Managed Care – PPO

## 2020-12-25 ENCOUNTER — Ambulatory Visit (INDEPENDENT_AMBULATORY_CARE_PROVIDER_SITE_OTHER): Payer: BC Managed Care – PPO | Admitting: Internal Medicine

## 2020-12-25 ENCOUNTER — Encounter: Payer: Self-pay | Admitting: Internal Medicine

## 2020-12-25 ENCOUNTER — Other Ambulatory Visit: Payer: Self-pay

## 2020-12-25 VITALS — BP 130/82 | HR 80 | Ht 59.5 in | Wt 262.6 lb

## 2020-12-25 DIAGNOSIS — E063 Autoimmune thyroiditis: Secondary | ICD-10-CM | POA: Diagnosis not present

## 2020-12-25 DIAGNOSIS — E041 Nontoxic single thyroid nodule: Secondary | ICD-10-CM

## 2020-12-25 NOTE — Patient Instructions (Signed)
Please stop at the lab.  Please come back for a follow-up appointment in 1 year.  

## 2020-12-25 NOTE — Progress Notes (Addendum)
Patient ID: Kristina Khan, female   DOB: 01/31/1981, 40 y.o.   MRN: [id number]   This visit occurred during the SARS-CoV-2 public health emergency.  Safety protocols were in place, including screening questions prior to the visit, additional usage of staff PPE, and extensive cleaning of exam room while observing appropriate contact time as indicated for disinfecting solutions.   HPI  Kristina Khan is a 40 y.o.-year-old very pleasant female, initially referred by her PCP, Elmer Ramp, Whitney L, PA ([facility]), returning for follow-up for a left thyroid nodule and Hashimoto's thyroiditis. She moved here from [place], [place], in 10/2018.  Last visit 1 year ago.  Interim history: She came back from a cruise in 10/2020 and tested positive for Covid 19. She had fatigue and a little more cough. Also developed R sided Bell's palsy >> was on ABx and steroids.  The symptoms resolved but she is now clearing her voice more. At last visit, she had significant thigh muscle cramps.  They have resolved since then, but now restarting in the last 2 months. She also has bone pain.  Reviewed history: Pt. has been dx with a thyroid nodule in 04/2019 after she presented at [facility] for Weight loss. TFTs were normal, but thyroid antibodies were high, pointing towards Hashimoto's thyroiditis.  She is not on levothyroxine.  She established care with a PCP afterwards who palpated a thyroid nodule >> sent for a thyroid U/S.  Retrospectively, she remembers her PCP in [place] mentioning that she had a goiter in the past.  Thyroid ultrasound (05/14/2019): Heterogeneous thyroid with a solid, hypoechoic, left mid lobe thyroid nodule measuring 1.5 x 0.8 x 0.5 cm.  By the current guidelines, a recommendation was made for biopsy.    At last visit, I sent her for an FNA of her left thyroid nodule (06/27/2019): Specimen Submitted:  A. THYROID, LEFT, FINE NEEDLE ASPIRATION:   FINAL MICROSCOPIC DIAGNOSIS:  - Consistent with  lymphocytic (Hashimoto) thyroiditis in the proper  clinical context (Bethesda category II)   SPECIMEN ADEQUACY:  Satisfactory for evaluation   Reviewed her TFTs: Lab Results  Component Value Date   TSH 1.13 12/24/2019   FREET4 1.08 12/24/2019  04/28/2019: TSH 1.604, fT4 1.02 (0.7-1.48), fT3 2.1 (2-4.1), TPO antibody 713 (0-6)  Pt denies: - feeling nodules in neck - hoarseness - dysphagia - choking - SOB with lying down - + dyscomfort with lying down on R side. - + clearing her throat more in last 2 months.  She has + FH of thyroid disorders in: MGM (goiter), M aunt. No FH of thyroid cancer. No h/o radiation tx to head or neck.  No herbal supplements. S previously on biotin, now off. + steroids use in 10/2020.   She has a history of gastric sleeve surgery in 2017.  Her weight decreased to 212 pounds, but since then, she gained a significant amount of back. She takes as needed Neurontin for neuropathy. Also prev. on naltrexone >> now off. She takes ergocalciferol 50,000 units weekly.  ROS: + See HPI Constitutional: + weight gain/no weight loss, no fatigue, no subjective hyperthermia, + subjective hypothermia Eyes: no blurry vision, no xerophthalmia ENT: no sore throat, + see HPI Cardiovascular: no CP/no SOB/no palpitations/no leg swelling Respiratory: no cough/no SOB/no wheezing Gastrointestinal: no N/no V/no D/no C/+ acid reflux -on Nexoim Musculoskeletal: + muscle cramps/no joint aches, but bone pain Skin: no rashes, + hair loss Neurological: no tremors/no numbness/no tingling/no dizziness  I reviewed pt's medications, allergies, PMH, social hx,  family hx, and changes were documented in the history of present illness. Otherwise, unchanged from my initial visit note.  No past medical history on file.  Social History   Socioeconomic History   Marital status: Single    Spouse name: Not on file   Number of children: Not on file   Years of education: Not on file    Highest education level: Not on file  Occupational History   Not on file  Tobacco Use   Smoking status: Never   Smokeless tobacco: Never  Substance and Sexual Activity   Alcohol use: Not on file   Drug use: Not on file   Sexual activity: Not on file  Other Topics Concern   Not on file  Social History Narrative   Not on file   Social Determinants of Health   Financial Resource Strain: Not on file  Food Insecurity: Not on file  Transportation Needs: Not on file  Physical Activity: Not on file  Stress: Not on file  Social Connections: Not on file  Intimate Partner Violence: Not on file   Current Outpatient Medications on File Prior to Visit  Medication Sig Dispense Refill   BIOTIN PO Take by mouth.     esomeprazole (NEXIUM) 40 MG capsule Take 40 mg by mouth daily.     gabapentin (NEURONTIN) 300 MG capsule Take 300 mg by mouth daily.     No current facility-administered medications on file prior to visit.   Allergies  Allergen Reactions   Shellfish Allergy Anaphylaxis   Penicillins Rash   No family history on file.   PE: BP 130/82 (BP Location: Right Arm, Patient Position: Sitting, Cuff Size: Normal)   Pulse 80   Ht 4' 11.5" (1.511 m)   Wt 262 lb 9.6 oz (119.1 kg)   SpO2 98%   BMI 52.15 kg/m  Wt Readings from Last 3 Encounters:  12/25/20 262 lb 9.6 oz (119.1 kg)  12/24/19 263 lb (119.3 kg)  06/21/19 265 lb (120.2 kg)   Constitutional: obese,  in NAD Eyes: PERRLA, EOMI, no exophthalmos ENT: moist mucous membranes, + palpable thyroid, no cervical lymphadenopathy Cardiovascular: RRR, No MRG Respiratory: CTA B Gastrointestinal: abdomen soft, NT, ND, BS+ Musculoskeletal: no deformities, strength intact in all 4 Skin: moist, warm, no rashes Neurological: no tremor with outstretched hands, DTR normal in all 4  ASSESSMENT: 1. L thyroid nodule  2. Hashimoto thyroiditis  PLAN: 1.  Left thyroid nodule -Patient with a history of left thyroid nodule that was not  large, did not contain microcalcifications or internal blood flow, it was not taller than wide and did not have irregular margins.  However, the nodule was hypoechoic.  Therefore, we did biopsy it on 06/27/2019 and the results were benign. -Of note, she does not have a high risk for cancer, since she did not have a personal history of radiation therapy to head or neck or a family history of thyroid cancer. -She continues to have some neck pressure especially if she lies on her right side.  No problems swallowing or choking. -She does have elevated thyroid antibodies, confirming Hashimoto's thyroiditis and I explained that this can at times cause neck pressure due to increased inflammation. We did discuss that if the neck pressure continues and intensifies, we may need a barium swallow.  At this visit, this appears to be stable, but she does complain of having to clear her throat more.  This could have started around the time when she had COVID-19, although  she had a mild form. -At this visit, we discussed about repeating her thyroid ultrasound  2. Hashimoto thyroiditis -On ultrasound, the thyroid appears heterogeneous, indicative of thyroiditis -Her thyroid tests were normal and she did not require levothyroxine yet -We have tried selenium in the past but she stopped after he developed muscle cramps. -At this visit, she again complains of muscle cramps, and also increased hunger, bone pain (fingers), some hair loss.  She has chronic constipation and cold intolerance.  If our investigation is negative today, she may need to see PCP for a more general evaluation. -we will recheck her TFTs -I will see her back in 1 year if her tests are normal   Component     Latest Ref Rng & Units 12/25/2020  Triiodothyronine,Free,Serum     2.3 - 4.2 pg/mL 3.7  T4,Free(Direct)     0.60 - 1.60 ng/dL 0.94  TSH     0.35 - 5.50 uIU/mL 1.98  Normal thyroid tests.  Thyroid U/S (12/31/2020): Parenchymal Echotexture:  Markedly heterogenous Isthmus: Normal in size measures 0.4 cm in diameter  Right lobe: Enlarged measuring 6.5 x 1.6 x 2.3 cm, previously, 6.0 x 1.4 x 2.2 cm Left lobe: Borderline enlarged measuring 5.0 x 1.5 x 2.4 cm, previously, 5.7 x 1.5 x 1.9 cm  _________________________________________________________ The previously biopsied 1.5 x 0.9 x 0.6 cm hypoechoic nodule involving the mid, posterior aspect of the left lobe of the thyroid (labeled 1) is unchanged compared to the 05/14/2019 examination, previously, 1.5 x 0.8 x 0.5 cm. Correlation with previous biopsy results is advised.   Adjacent to the inferior pole of the left lobe of the thyroid are too hypoechoic nodules, one measuring 1.0 x 0.6 cm and other measuring 1.0 x 0.7 cm (both nodule seen on image 27), unchanged compared to the 05/14/2019 examination   IMPRESSION: 1. Similar appearing enlarged and markedly heterogeneous thyroid without worrisome new or enlarging thyroid nodule. 2. Previously biopsied solitary left-sided thyroid nodule is unchanged compared to the 05/14/2019 examination. Correlation with previous biopsy results is advised. Assuming a benign pathologic diagnosis, repeat sampling and/or continued dedicated follow-up is not recommended. 3. Adjacent to the inferior pole of the left lobe of the thyroid are two hypoechoic nodules, unchanged compared to the 05/14/2019 examination with differential considerations including non pathologically enlarged cervical lymph nodes versus parathyroid adenomas. Clinical correlation is advised. Further evaluation with nuclear medicine parathyroid scintigraphy and/or contrast-enhanced neck CT could be performed as indicated.  Will let the patient know about the above results.  Per my knowledge, she does not have a history of hypercalcemia.  If labs were not recently checked by PCP, will need to check her calcium (along with vitamin D, PTH level).  Philemon Kingdom, MD PhD [facility]  Endocrinology

## 2020-12-26 LAB — T3, FREE: T3, Free: 3.7 pg/mL (ref 2.3–4.2)

## 2020-12-26 LAB — T4, FREE: Free T4: 0.94 ng/dL (ref 0.60–1.60)

## 2020-12-26 LAB — TSH: TSH: 1.98 u[IU]/mL (ref 0.35–5.50)

## 2020-12-30 ENCOUNTER — Ambulatory Visit
Admission: RE | Admit: 2020-12-30 | Discharge: 2020-12-30 | Disposition: A | Discharge status: Home / Self Care | Payer: BC Managed Care – PPO | Source: Ambulatory Visit | Attending: Internal Medicine | Admitting: Internal Medicine

## 2020-12-30 DIAGNOSIS — E041 Nontoxic single thyroid nodule: Secondary | ICD-10-CM

## 2021-01-01 ENCOUNTER — Other Ambulatory Visit: Payer: Self-pay | Admitting: Internal Medicine

## 2021-01-01 ENCOUNTER — Encounter: Payer: Self-pay | Admitting: Internal Medicine

## 2021-01-01 DIAGNOSIS — E042 Nontoxic multinodular goiter: Secondary | ICD-10-CM

## 2021-01-01 DIAGNOSIS — E041 Nontoxic single thyroid nodule: Secondary | ICD-10-CM

## 2021-01-07 ENCOUNTER — Other Ambulatory Visit: Payer: Self-pay

## 2021-01-07 ENCOUNTER — Other Ambulatory Visit (INDEPENDENT_AMBULATORY_CARE_PROVIDER_SITE_OTHER): Payer: BC Managed Care – PPO

## 2021-01-07 DIAGNOSIS — E042 Nontoxic multinodular goiter: Secondary | ICD-10-CM | POA: Diagnosis not present

## 2021-01-08 LAB — PTH, INTACT AND CALCIUM
Calcium: 9 mg/dL (ref 8.7–10.2)
PTH: 34 pg/mL (ref 15–65)

## 2021-01-08 LAB — VITAMIN D 25 HYDROXY (VIT D DEFICIENCY, FRACTURES): Vit D, 25-Hydroxy: 46.2 ng/mL (ref 30.0–100.0)

## 2021-02-07 ENCOUNTER — Ambulatory Visit
Admission: EM | Admit: 2021-02-07 | Discharge: 2021-02-07 | Disposition: A | Discharge status: Home / Self Care | Payer: BC Managed Care – PPO | Attending: Physician Assistant | Admitting: Physician Assistant

## 2021-02-07 ENCOUNTER — Other Ambulatory Visit: Payer: Self-pay

## 2021-02-07 ENCOUNTER — Ambulatory Visit (INDEPENDENT_AMBULATORY_CARE_PROVIDER_SITE_OTHER): Payer: BC Managed Care – PPO

## 2021-02-07 ENCOUNTER — Encounter: Payer: Self-pay | Admitting: General Practice

## 2021-02-07 DIAGNOSIS — S8992XA Unspecified injury of left lower leg, initial encounter: Secondary | ICD-10-CM

## 2021-02-07 DIAGNOSIS — M25562 Pain in left knee: Secondary | ICD-10-CM | POA: Diagnosis not present

## 2021-02-07 NOTE — ED Triage Notes (Signed)
Fell yesterday at the football game, tripped over the ledge, the left knee is more painful than the right, did not hit the head, the left leg tends to be numb when patient lays flat. It was red and hot last night, used ice

## 2021-02-07 NOTE — ED Provider Notes (Signed)
[facility]    CSN: [id number] Arrival date & time: 02/07/21  0944      History   Chief Complaint Chief Complaint  Patient presents with   Knee Pain    HPI Kristina Khan is a 40 y.o. female.   Patient here today for evaluation of left knee pain that started last night after she tripped over a curb and fell onto her hands and knees.  She reports minimal pain in her right knee, although she did fall on both of her knees at the time of the fall.  She states that she is able to walk but is not putting a lot of weight on her left knee.  Movement of her knee makes pain worse.  She states she will feel a tightening in her posterior knee when she tries to straighten her leg fully.  She has a few small abrasions to bilateral palms from the fall but no other wounds.  She denies any numbness or tingling other than when she lays down if she straightens her left knee sometimes she will have tingling in her lower left leg.  She did not hit her head at the time of fall.  She reports that she tried using ice last night but has not used any other treatment.  The history is provided by the patient.  Knee Pain Associated symptoms: no fever    History reviewed. No pertinent past medical history.  There are no problems to display for this patient.   History reviewed. No pertinent surgical history.  OB History   No obstetric history on file.      Home Medications    Prior to Admission medications   Medication Sig Start Date End Date Taking? Authorizing Provider  esomeprazole (NEXIUM) 40 MG capsule Take 40 mg by mouth daily. 06/21/19  Yes [provider]  gabapentin (NEURONTIN) 300 MG capsule Take 300 mg by mouth daily. 06/21/19  Yes [provider]  BIOTIN PO Take by mouth. Patient not taking: Reported on 12/25/2020    [provider]    Family History History reviewed. No pertinent family history.  Social History Social History   Tobacco Use    Smoking status: Never   Smokeless tobacco: Never     Allergies   Shellfish allergy and Penicillins   Review of Systems Review of Systems  Constitutional:  Negative for chills and fever.  Eyes:  Negative for discharge and redness.  Gastrointestinal:  Negative for abdominal pain, nausea and vomiting.  Genitourinary:  Positive for vaginal bleeding and vaginal discharge.  Musculoskeletal:  Positive for arthralgias. Negative for joint swelling.  Skin:  Positive for wound. Negative for color change.    Physical Exam Triage Vital Signs ED Triage Vitals [02/07/21 1002]  Enc Vitals Group     BP 125/77     Pulse Rate 73     Resp 16     Temp 98.5 F (36.9 C)     Temp Source Oral     SpO2 98 %     Weight      Height      Head Circumference      Peak Flow      Pain Score      Pain Loc      Pain Edu?      Excl. in [place]?    No data found.  Updated Vital Signs BP 125/77 (BP Location: Left Arm)   Pulse 73   Temp 98.5 F (36.9  C) (Oral)   Resp 16   LMP  (LMP Unknown)   SpO2 98%      Physical Exam Vitals and nursing note reviewed.  Constitutional:      General: She is not in acute distress.    Appearance: Normal appearance. She is not ill-appearing.  HENT:     Head: Normocephalic and atraumatic.  Eyes:     Conjunctiva/sclera: Conjunctivae normal.  Cardiovascular:     Rate and Rhythm: Normal rate.  Pulmonary:     Effort: Pulmonary effort is normal.  Musculoskeletal:     Comments: Full range of motion of right knee, mild decreased extension and flexion of left knee due to pain.  Tenderness to palpation noted diffusely to anterior left knee, no tenderness to palpation noted to posterior knee or left calf.  Neurological:     Mental Status: She is alert.  Psychiatric:        Mood and Affect: Mood normal.        Behavior: Behavior normal.        Thought Content: Thought content normal.     UC Treatments / Results  Labs (all labs ordered are listed, but only  abnormal results are displayed) Labs Reviewed - No data to display  EKG   Radiology DG Knee AP/LAT W/Sunrise Left  Result Date: 02/07/2021 CLINICAL DATA:  Status post fall.  Injured left knee. EXAM: LEFT KNEE 3 VIEWS COMPARISON:  None. FINDINGS: No acute fracture or dislocation. No aggressive osseous lesion. Normal alignment. Tiny medial femorotibial compartment marginal osteophytes. Soft tissue are unremarkable. No radiopaque foreign body or soft tissue emphysema. IMPRESSION: No acute osseous injury of the left knee. Electronically Signed   By: Kathreen Devoid M.D.   On: 02/07/2021 11:13    Procedures Procedures (including critical care time)  Medications Ordered in UC Medications - No data to display  Initial Impression / Assessment and Plan / UC Course  I have reviewed the triage vital signs and the nursing notes.  Pertinent labs & imaging results that were available during my care of the patient were reviewed by me and considered in my medical decision making (see chart for details).   Xray ordered without fracture. Recommended ibuprofen, compression wrap if needed for pain. Encouraged follow up with ortho if symptoms do not gradually improve.   Final Clinical Impressions(s) / UC Diagnoses   Final diagnoses:  Injury of left knee, initial encounter     Discharge Instructions      Follow up with ortho if needed. Take ibuprofen as needed.      ED Prescriptions   None    PDMP not reviewed this encounter.   Francene Finders, PA-C 02/07/21 1122

## 2021-02-07 NOTE — Discharge Instructions (Signed)
Follow up with ortho if needed. Take ibuprofen as needed.

## 2021-03-11 ENCOUNTER — Other Ambulatory Visit: Payer: Self-pay | Admitting: Internal Medicine

## 2021-03-11 DIAGNOSIS — D351 Benign neoplasm of parathyroid gland: Secondary | ICD-10-CM | POA: Insufficient documentation

## 2021-03-25 ENCOUNTER — Other Ambulatory Visit: Payer: Self-pay

## 2021-03-25 ENCOUNTER — Ambulatory Visit: Payer: BC Managed Care – PPO | Admitting: Cardiology

## 2021-03-25 ENCOUNTER — Encounter: Payer: Self-pay | Admitting: Cardiology

## 2021-03-25 VITALS — BP 128/84 | HR 83 | Ht 59.0 in | Wt 263.0 lb

## 2021-03-25 DIAGNOSIS — E8881 Metabolic syndrome: Secondary | ICD-10-CM | POA: Diagnosis not present

## 2021-03-25 DIAGNOSIS — R0602 Shortness of breath: Secondary | ICD-10-CM | POA: Diagnosis not present

## 2021-03-25 NOTE — Progress Notes (Signed)
Cardiology Office Note:    Date:  03/25/2021   ID:  Kristina Khan, DOB [birth year], MRN [id number]  PCP:  Chaney Malling, PA  Cardiologist:  Berniece Salines, DO  Electrophysiologist:  None   Referring MD: Bobbye Riggs, FNP   Chief Complaint  Patient presents with   New Patient (Initial Visit)   Headache   Shortness of Breath   Numbness    Feet.    History of Present Illness:    Kristina Khan is a 40 y.o. female with a hx of Hashimoto's thyroiditis with left thyroid nodule who follows with endocrine.  She tells me recently she was experiencing some respiratory symptoms that she saw her PCP who started to treat her for bronchitis and then started on medicine for cough but had a chest x-ray which did not show anemia but there was concern for cardiomegaly she said.  And she was so that she was in heart failure.  She does have shortness of breath but she denied any weight gain or any significant leg swelling.  No evidence of orthopnea or PND.  History reviewed. No pertinent past medical history.  History reviewed. No pertinent surgical history.  Current Medications: Current Meds  Medication Sig   BIOTIN PO Take by mouth.   esomeprazole (NEXIUM) 40 MG capsule Take 40 mg by mouth daily.   gabapentin (NEURONTIN) 300 MG capsule Take 300 mg by mouth daily.     Allergies:   Shellfish allergy and Penicillins   Social History   Socioeconomic History   Marital status: Single    Spouse name: Not on file   Number of children: Not on file   Years of education: Not on file   Highest education level: Not on file  Occupational History   Not on file  Tobacco Use   Smoking status: Never   Smokeless tobacco: Never  Substance and Sexual Activity   Alcohol use: Not on file   Drug use: Not on file   Sexual activity: Not on file  Other Topics Concern   Not on file  Social History Narrative   Not on file   Social Determinants of Health   Financial Resource Strain: Not  on file  Food Insecurity: Not on file  Transportation Needs: Not on file  Physical Activity: Not on file  Stress: Not on file  Social Connections: Not on file     Family History: The patient's family history is not on file.  ROS:   Review of Systems  Constitution: Negative for decreased appetite, fever and weight gain.  HENT: Negative for congestion, ear discharge, hoarse voice and sore throat.   Eyes: Negative for discharge, redness, vision loss in right eye and visual halos.  Cardiovascular: Negative for chest pain, dyspnea on exertion, leg swelling, orthopnea and palpitations.  Respiratory: Negative for cough, hemoptysis, shortness of breath and snoring.   Endocrine: Negative for heat intolerance and polyphagia.  Hematologic/Lymphatic: Negative for bleeding problem. Does not bruise/bleed easily.  Skin: Negative for flushing, nail changes, rash and suspicious lesions.  Musculoskeletal: Negative for arthritis, joint pain, muscle cramps, myalgias, neck pain and stiffness.  Gastrointestinal: Negative for abdominal pain, bowel incontinence, diarrhea and excessive appetite.  Genitourinary: Negative for decreased libido, genital sores and incomplete emptying.  Neurological: Negative for brief paralysis, focal weakness, headaches and loss of balance.  Psychiatric/Behavioral: Negative for altered mental status, depression and suicidal ideas.  Allergic/Immunologic: Negative for HIV exposure and persistent infections.    EKGs/Labs/Other Studies Reviewed:  The following studies were reviewed today:   EKG:  The ekg ordered today demonstrates sinus rhythm, heart rate 83 bpm.  Recent Labs: 12/25/2020: TSH 1.98  Recent Lipid Panel No results found for: CHOL, TRIG, HDL, CHOLHDL, VLDL, LDLCALC, LDLDIRECT  Physical Exam:    VS:  BP 128/84 (BP Location: Right Arm, Patient Position: Sitting, Cuff Size: Large)    Pulse 83    Ht 4\' 11"  (1.499 m)    Wt 263 lb (119.3 kg)    BMI 53.12 kg/m      Wt Readings from Last 3 Encounters:  03/25/21 263 lb (119.3 kg)  12/25/20 262 lb 9.6 oz (119.1 kg)  12/24/19 263 lb (119.3 kg)     GEN: Well nourished, well developed in no acute distress HEENT: Normal NECK: No JVD; No carotid bruits LYMPHATICS: No lymphadenopathy CARDIAC: S1S2 noted,RRR, no murmurs, rubs, gallops RESPIRATORY:  Clear to auscultation without rales, wheezing or rhonchi  ABDOMEN: Soft, non-tender, non-distended, +bowel sounds, no guarding. EXTREMITIES: No edema, No cyanosis, no clubbing MUSCULOSKELETAL:  No deformity  SKIN: Warm and dry NEUROLOGIC:  Alert and oriented x 3, non-focal PSYCHIATRIC:  Normal affect, good insight  ASSESSMENT:    1. SOB (shortness of breath)   2. Metabolic syndrome   3. Morbid obesity ([place])    PLAN:    Unfortunately the chest x-ray is not available to me however the patient is experiencing shortness of breath so I will like to proceed with the echocardiogram for completeness to rule out any structural abnormalities.  Especially the fact that the patient does have history of thyroid dysfunction.  We will also get BNP.  The patient understands the need to lose weight with diet and exercise. We have discussed specific strategies for this.  Metabolic syndrome-we will get hemoglobin A1c, she tells me she had recent lipid profile for her safety PCP were going to request the results  The patient is in agreement with the above plan. The patient left the office in stable condition.  The patient will follow up in 1 year   Medication Adjustments/Labs and Tests Ordered: Current medicines are reviewed at length with the patient today.  Concerns regarding medicines are outlined above.  Orders Placed This Encounter  Procedures   Basic Metabolic Panel (BMET)   Magnesium   CBC with Differential/Platelet   Pro b natriuretic peptide (BNP)   Hemoglobin A1c   EKG 12-Lead   ECHOCARDIOGRAM COMPLETE    No orders of the defined types were placed in  this encounter.   Patient Instructions  Medication Instructions:  Your physician recommends that you continue on your current medications as directed. Please refer to the Current Medication list given to you today.  *If you need a refill on your cardiac medications before your next appointment, please call your pharmacy*   Lab Work: Your physician recommends that you return for lab work in:  TODAY: BMET, Mag, CBC, BNP, HgbA1C If you have labs (blood work) drawn today and your tests are completely normal, you will receive your results only by: MyChart Message (if you have MyChart) OR A paper copy in the mail If you have any lab test that is abnormal or we need to change your treatment, we will call you to review the results.   Testing/Procedures: Your physician has requested that you have an echocardiogram. Echocardiography is a painless test that uses sound waves to create images of your heart. It provides your doctor with information about the size and shape of your  heart and how well your hearts chambers and valves are working. This procedure takes approximately one hour. There are no restrictions for this procedure.    Follow-Up: At [facility], you and your health needs are our priority.  As part of our continuing mission to provide you with exceptional heart care, we have created designated Provider Care Teams.  These Care Teams include your primary Cardiologist (physician) and Advanced Practice Providers (APPs -  Physician Assistants and Nurse Practitioners) who all work together to provide you with the care you need, when you need it.  We recommend signing up for the patient portal called "MyChart".  Sign up information is provided on this After Visit Summary.  MyChart is used to connect with patients for Virtual Visits (Telemedicine).  Patients are able to view lab/test results, encounter notes, upcoming appointments, etc.  Non-urgent messages can be sent to your provider as  well.   To learn more about what you can do with MyChart, go to [email or web address].    Your next appointment:   1 year(s)  The format for your next appointment:   In Person  Provider:   Berniece Salines, DO     Other Instructions     Adopting a Healthy Lifestyle.  Know what a healthy weight is for you (roughly BMI <25) and aim to maintain this   Aim for 7+ servings of fruits and vegetables daily   65-80+ fluid ounces of water or unsweet tea for healthy kidneys   Limit to max 1 drink of alcohol per day; avoid smoking/tobacco   Limit animal fats in diet for cholesterol and heart health - choose grass fed whenever available   Avoid highly processed foods, and foods high in saturated/trans fats   Aim for low stress - take time to unwind and care for your mental health   Aim for 150 min of moderate intensity exercise weekly for heart health, and weights twice weekly for bone health   Aim for 7-9 hours of sleep daily   When it comes to diets, agreement about the perfect plan isnt easy to find, even among the experts. Experts at the [place] [employer] developed an idea known as the Healthy Eating Plate. Just imagine a plate divided into logical, healthy portions.   The emphasis is on diet quality:   Load up on vegetables and fruits - one-half of your plate: Aim for color and variety, and remember that potatoes dont count.   Go for whole grains - one-quarter of your plate: Whole wheat, barley, wheat berries, quinoa, oats, brown rice, and foods made with them. If you want pasta, go with whole wheat pasta.   Protein power - one-quarter of your plate: Fish, chicken, beans, and nuts are all healthy, versatile protein sources. Limit red meat.   The diet, however, does go beyond the plate, offering a few other suggestions.   Use healthy plant oils, such as olive, canola, soy, corn, sunflower and peanut. Check the labels, and avoid partially hydrogenated oil, which  have unhealthy trans fats.   If youre thirsty, drink water. Coffee and tea are good in moderation, but skip sugary drinks and limit milk and dairy products to one or two daily servings.   The type of carbohydrate in the diet is more important than the amount. Some sources of carbohydrates, such as vegetables, fruits, whole grains, and beans-are healthier than others.   Finally, stay active  Signed, Berniece Salines, DO  03/25/2021 4:41 PM  [facility]

## 2021-03-25 NOTE — Patient Instructions (Signed)
Medication Instructions:  Your physician recommends that you continue on your current medications as directed. Please refer to the Current Medication list given to you today.  *If you need a refill on your cardiac medications before your next appointment, please call your pharmacy*   Lab Work: Your physician recommends that you return for lab work in:  TODAY: BMET, Mag, CBC, BNP, HgbA1C If you have labs (blood work) drawn today and your tests are completely normal, you will receive your results only by: MyChart Message (if you have MyChart) OR A paper copy in the mail If you have any lab test that is abnormal or we need to change your treatment, we will call you to review the results.   Testing/Procedures: Your physician has requested that you have an echocardiogram. Echocardiography is a painless test that uses sound waves to create images of your heart. It provides your doctor with information about the size and shape of your heart and how well your hearts chambers and valves are working. This procedure takes approximately one hour. There are no restrictions for this procedure.    Follow-Up: At [facility], you and your health needs are our priority.  As part of our continuing mission to provide you with exceptional heart care, we have created designated Provider Care Teams.  These Care Teams include your primary Cardiologist (physician) and Advanced Practice Providers (APPs -  Physician Assistants and Nurse Practitioners) who all work together to provide you with the care you need, when you need it.  We recommend signing up for the patient portal called "MyChart".  Sign up information is provided on this After Visit Summary.  MyChart is used to connect with patients for Virtual Visits (Telemedicine).  Patients are able to view lab/test results, encounter notes, upcoming appointments, etc.  Non-urgent messages can be sent to your provider as well.   To learn more about what you can do  with MyChart, go to [email or web address].    Your next appointment:   1 year(s)  The format for your next appointment:   In Person  Provider:   Berniece Salines, DO     Other Instructions

## 2021-03-27 ENCOUNTER — Other Ambulatory Visit: Payer: Self-pay

## 2021-03-27 ENCOUNTER — Encounter: Payer: Self-pay | Admitting: Cardiology

## 2021-03-27 ENCOUNTER — Ambulatory Visit: Payer: BC Managed Care – PPO | Admitting: Cardiology

## 2021-03-27 LAB — CBC WITH DIFFERENTIAL/PLATELET
Basophils Absolute: 0.1 10*3/uL (ref 0.0–0.2)
Basos: 1 %
EOS (ABSOLUTE): 0.1 10*3/uL (ref 0.0–0.4)
Eos: 1 %
Hematocrit: 39.7 % (ref 34.0–46.6)
Hemoglobin: 12.8 g/dL (ref 11.1–15.9)
Immature Grans (Abs): 0 10*3/uL (ref 0.0–0.1)
Immature Granulocytes: 0 %
Lymphocytes Absolute: 4.3 10*3/uL — ABNORMAL HIGH (ref 0.7–3.1)
Lymphs: 27 %
MCH: 26.6 pg (ref 26.6–33.0)
MCHC: 32.2 g/dL (ref 31.5–35.7)
MCV: 83 fL (ref 79–97)
Monocytes Absolute: 1.3 10*3/uL — ABNORMAL HIGH (ref 0.1–0.9)
Monocytes: 8 %
Neutrophils Absolute: 10 10*3/uL — ABNORMAL HIGH (ref 1.4–7.0)
Neutrophils: 63 %
Platelets: 315 10*3/uL (ref 150–450)
RBC: 4.81 x10E6/uL (ref 3.77–5.28)
RDW: 13.1 % (ref 11.7–15.4)
WBC: 15.8 10*3/uL — ABNORMAL HIGH (ref 3.4–10.8)

## 2021-03-27 LAB — HEMOGLOBIN A1C
Est. average glucose Bld gHb Est-mCnc: 128 mg/dL
Hgb A1c MFr Bld: 6.1 % — ABNORMAL HIGH (ref 4.8–5.6)

## 2021-03-27 LAB — BASIC METABOLIC PANEL
BUN/Creatinine Ratio: 21 (ref 9–23)
BUN: 16 mg/dL (ref 6–24)
CO2: 22 mmol/L (ref 20–29)
Calcium: 9.5 mg/dL (ref 8.7–10.2)
Chloride: 99 mmol/L (ref 96–106)
Creatinine, Ser: 0.77 mg/dL (ref 0.57–1.00)
Glucose: 103 mg/dL — ABNORMAL HIGH (ref 70–99)
Potassium: 3.7 mmol/L (ref 3.5–5.2)
Sodium: 137 mmol/L (ref 134–144)
eGFR: 99 mL/min/{1.73_m2} (ref >59)

## 2021-03-27 LAB — MAGNESIUM: Magnesium: 1.8 mg/dL (ref 1.6–2.3)

## 2021-03-27 LAB — PRO B NATRIURETIC PEPTIDE: NT-Pro BNP: 283 pg/mL — ABNORMAL HIGH (ref 0–130)

## 2021-03-27 MED ORDER — POTASSIUM CHLORIDE ER 10 MEQ PO TBCR: 10.0000 meq | EXTENDED_RELEASE_TABLET | ORAL | 3 refills | Status: DC

## 2021-03-27 MED ORDER — FUROSEMIDE 20 MG PO TABS: 20.0000 mg | ORAL_TABLET | ORAL | 3 refills | Status: DC

## 2021-03-27 NOTE — Progress Notes (Signed)
Orders placed.

## 2021-03-27 NOTE — Progress Notes (Signed)
Orders placed per Dr. Terrial Rhodes request.

## 2021-03-31 NOTE — Telephone Encounter (Signed)
Dr.Tobb spoke to pt. See chart.

## 2021-04-14 ENCOUNTER — Ambulatory Visit (HOSPITAL_COMMUNITY): Payer: BC Managed Care – PPO | Attending: Cardiovascular Disease

## 2021-04-14 ENCOUNTER — Other Ambulatory Visit: Payer: Self-pay

## 2021-04-14 DIAGNOSIS — R0602 Shortness of breath: Secondary | ICD-10-CM | POA: Diagnosis not present

## 2021-04-14 LAB — ECHOCARDIOGRAM COMPLETE
Area-P 1/2: 4.39 cm2
S' Lateral: 2.7 cm

## 2021-04-15 ENCOUNTER — Encounter: Payer: Self-pay | Admitting: Cardiology

## 2021-04-16 NOTE — Telephone Encounter (Signed)
[facility] is calling due to not hearing back in regards to this. States you can callback or respond via [place].

## 2021-04-23 ENCOUNTER — Encounter: Payer: Self-pay | Admitting: Cardiology

## 2021-06-17 ENCOUNTER — Encounter: Payer: Self-pay | Admitting: Cardiology

## 2021-06-17 ENCOUNTER — Other Ambulatory Visit: Payer: Self-pay

## 2021-06-17 ENCOUNTER — Ambulatory Visit (INDEPENDENT_AMBULATORY_CARE_PROVIDER_SITE_OTHER): Payer: BC Managed Care – PPO | Admitting: Cardiology

## 2021-06-17 VITALS — BP 140/94 | HR 77 | Ht 59.0 in | Wt 266.0 lb

## 2021-06-17 DIAGNOSIS — R7303 Prediabetes: Secondary | ICD-10-CM | POA: Diagnosis not present

## 2021-06-17 DIAGNOSIS — R030 Elevated blood-pressure reading, without diagnosis of hypertension: Secondary | ICD-10-CM | POA: Diagnosis not present

## 2021-06-17 NOTE — Patient Instructions (Addendum)
Medication Instructions:  Your physician recommends that you continue on your current medications as directed. Please refer to the Current Medication list given to you today.  *If you need a refill on your cardiac medications before your next appointment, please call your pharmacy*   Lab Work: None If you have labs (blood work) drawn today and your tests are completely normal, you will receive your results only by: [place] (if you have MyChart) OR A paper copy in the mail If you have any lab test that is abnormal or we need to change your treatment, we will call you to review the results.   Testing/Procedures: None   Follow-Up: At [facility], you and your health needs are our priority.  As part of our continuing mission to provide you with exceptional heart care, we have created designated Provider Care Teams.  These Care Teams include your primary Cardiologist (physician) and Advanced Practice Providers (APPs -  Physician Assistants and Nurse Practitioners) who all work together to provide you with the care you need, when you need it.  We recommend signing up for the patient portal called "MyChart".  Sign up information is provided on this After Visit Summary.  MyChart is used to connect with patients for Virtual Visits (Telemedicine).  Patients are able to view lab/test results, encounter notes, upcoming appointments, etc.  Non-urgent messages can be sent to your provider as well.   To learn more about what you can do with MyChart, go to [email or web address].    Your next appointment:   1 year(s)  The format for your next appointment:   In Person  Provider:   Berniece Salines, DO     Other Instructions  Diabetes Mellitus and Nutrition, Adult When you have diabetes, or diabetes mellitus, it is very important to have healthy eating habits because your blood sugar (glucose) levels are greatly affected by what you eat and drink. Eating healthy foods in the right amounts, at  about the same times every day, can help you: Manage your blood glucose. Lower your risk of heart disease. Improve your blood pressure. Reach or maintain a healthy weight. What can affect my meal plan? Every person with diabetes is different, and each person has different needs for a meal plan. Your health care provider may recommend that you work with a dietitian to make a meal plan that is best for you. Your meal plan may vary depending on factors such as: The calories you need. The medicines you take. Your weight. Your blood glucose, blood pressure, and cholesterol levels. Your activity level. Other health conditions you have, such as heart or kidney disease. How do carbohydrates affect me? Carbohydrates, also called carbs, affect your blood glucose level more than any other type of food. Eating carbs raises the amount of glucose in your blood. It is important to know how many carbs you can safely have in each meal. This is different for every person. Your dietitian can help you calculate how many carbs you should have at each meal and for each snack. How does alcohol affect me? Alcohol can cause a decrease in blood glucose (hypoglycemia), especially if you use insulin or take certain diabetes medicines by mouth. Hypoglycemia can be a life-threatening condition. Symptoms of hypoglycemia, such as sleepiness, dizziness, and confusion, are similar to symptoms of having too much alcohol. Do not drink alcohol if: Your health care provider tells you not to drink. You are pregnant, may be pregnant, or are planning to become pregnant. If you  drink alcohol: Limit how much you have to: 0-1 drink a day for women. 0-2 drinks a day for men. Know how much alcohol is in your drink. In the [place] one drink equals one 12 oz bottle of beer (355 mL), one 5 oz glass of wine (148 mL), or one 1 oz glass of hard liquor (44 mL). Keep yourself hydrated with water, diet soda, or unsweetened iced tea. Keep in mind  that regular soda, juice, and other mixers may contain a lot of sugar and must be counted as carbs. What are tips for following this plan? Reading food labels Start by checking the serving size on the Nutrition Facts label of packaged foods and drinks. The number of calories and the amount of carbs, fats, and other nutrients listed on the label are based on one serving of the item. Many items contain more than one serving per package. Check the total grams (g) of carbs in one serving. Check the number of grams of saturated fats and trans fats in one serving. Choose foods that have a low amount or none of these fats. Check the number of milligrams (mg) of salt (sodium) in one serving. Most people should limit total sodium intake to less than 2,300 mg per day. Always check the nutrition information of foods labeled as "low-fat" or "nonfat." These foods may be higher in added sugar or refined carbs and should be avoided. Talk to your dietitian to identify your daily goals for nutrients listed on the label. Shopping Avoid buying canned, pre-made, or processed foods. These foods tend to be high in fat, sodium, and added sugar. Shop around the outside edge of the grocery store. This is where you will most often find fresh fruits and vegetables, bulk grains, fresh meats, and fresh dairy products. Cooking Use low-heat cooking methods, such as baking, instead of high-heat cooking methods, such as deep frying. Cook using healthy oils, such as olive, canola, or sunflower oil. Avoid cooking with butter, cream, or high-fat meats. Meal planning Eat meals and snacks regularly, preferably at the same times every day. Avoid going long periods of time without eating. Eat foods that are high in fiber, such as fresh fruits, vegetables, beans, and whole grains. Eat 4-6 oz (112-168 g) of lean protein each day, such as lean meat, chicken, fish, eggs, or tofu. One ounce (oz) (28 g) of lean protein is equal to: 1 oz (28  g) of meat, chicken, or fish. 1 egg.  cup (62 g) of tofu. Eat some foods each day that contain healthy fats, such as avocado, nuts, seeds, and fish. What foods should I eat? Fruits Berries. Apples. Oranges. Peaches. Apricots. Plums. Grapes. Mangoes. Papayas. Pomegranates. Kiwi. Cherries. Vegetables Leafy greens, including lettuce, spinach, kale, chard, collard greens, mustard greens, and cabbage. Beets. Cauliflower. Broccoli. Carrots. Green beans. Tomatoes. Peppers. Onions. Cucumbers. Brussels sprouts. Grains Whole grains, such as whole-wheat or whole-grain bread, crackers, tortillas, cereal, and pasta. Unsweetened oatmeal. Quinoa. Brown or wild rice. Meats and other proteins Seafood. Poultry without skin. Lean cuts of poultry and beef. Tofu. Nuts. Seeds. Dairy Low-fat or fat-free dairy products such as milk, yogurt, and cheese. The items listed above may not be a complete list of foods and beverages you can eat and drink. Contact a dietitian for more information. What foods should I avoid? Fruits Fruits canned with syrup. Vegetables Canned vegetables. Frozen vegetables with butter or cream sauce. Grains Refined white flour and flour products such as bread, pasta, snack foods, and cereals. Avoid  all processed foods. Meats and other proteins Fatty cuts of meat. Poultry with skin. Breaded or fried meats. Processed meat. Avoid saturated fats. Dairy Full-fat yogurt, cheese, or milk. Beverages Sweetened drinks, such as soda or iced tea. The items listed above may not be a complete list of foods and beverages you should avoid. Contact a dietitian for more information. Questions to ask a health care provider Do I need to meet with a certified diabetes care and education specialist? Do I need to meet with a dietitian? What number can I call if I have questions? When are the best times to check my blood glucose? Where to find more information: American Diabetes Association:  diabetes.org Academy of Nutrition and Dietetics: eatright.[employer] of Diabetes and Digestive and Kidney Diseases: [employer] Association of Diabetes Care & Education Specialists: diabeteseducator.org Summary It is important to have healthy eating habits because your blood sugar (glucose) levels are greatly affected by what you eat and drink. It is important to use alcohol carefully. A healthy meal plan will help you manage your blood glucose and lower your risk of heart disease. Your health care provider may recommend that you work with a dietitian to make a meal plan that is best for you. This information is not intended to replace advice given to you by your health care provider. Make sure you discuss any questions you have with your health care provider. Document Revised: 10/31/2019 Document Reviewed: 10/31/2019 Elsevier Patient Education  [place].

## 2021-06-17 NOTE — Progress Notes (Signed)
Cardiology Office Note:    Date:  06/18/2021   ID:  [name], DOB June 19, 1980, MRN [id number]  PCP:  Chaney Malling, PA  Cardiologist:  Berniece Salines, DO  Electrophysiologist:  None   Referring MD: Chaney Malling, PA   " I am doing fine"  History of Present Illness:    Kristina Khan is a 41 y.o. female with a hx of Hashimoto's thyroiditis with left thyroid nodule, who was initially referred by her primary care doctor due to shortness of breath as well as concern for abnormal chest x-ray showing cardiomegaly.  During my initial encounter with the patient which was March 25, 2021 I order echocardiogram also get hemoglobin A1c for screening given her metabolic syndrome.  She did have some leg edema BNP was ordered.  And given progressive swelling of breath started the patient on Lasix once weekly.  She has been able to tolerate the Lasix once weekly.  Heart testing did show a normal echocardiogram however hemoglobin A1c revealed prediabetes.   Past Medical History:  Diagnosis Date   Hashimoto's thyroiditis    Obesity    Prediabetes     History reviewed. No pertinent surgical history.  Current Medications: Current Meds  Medication Sig   BIOTIN PO Take by mouth 3 (three) times a week.   esomeprazole (NEXIUM) 40 MG capsule Take 40 mg by mouth daily.   furosemide (LASIX) 20 MG tablet Take 1 tablet (20 mg total) by mouth once a week.   gabapentin (NEURONTIN) 300 MG capsule Take 300 mg by mouth daily.   potassium chloride (KLOR-CON) 10 MEQ tablet Take 1 tablet (10 mEq total) by mouth once a week.     Allergies:   Shellfish allergy and Penicillins   Social History   Socioeconomic History   Marital status: Single    Spouse name: Not on file   Number of children: Not on file   Years of education: Not on file   Highest education level: Not on file  Occupational History   Not on file  Tobacco Use   Smoking status: Never   Smokeless tobacco: Never   Substance and Sexual Activity   Alcohol use: Not on file   Drug use: Not on file   Sexual activity: Not on file  Other Topics Concern   Not on file  Social History Narrative   Not on file   Social Determinants of Health   Financial Resource Strain: Not on file  Food Insecurity: Not on file  Transportation Needs: Not on file  Physical Activity: Not on file  Stress: Not on file  Social Connections: Not on file     Family History: The patient's family history is not on file.  ROS:   Review of Systems  Constitution: Negative for decreased appetite, fever and weight gain.  HENT: Negative for congestion, ear discharge, hoarse voice and sore throat.   Eyes: Negative for discharge, redness, vision loss in right eye and visual halos.  Cardiovascular: Negative for chest pain, dyspnea on exertion, leg swelling, orthopnea and palpitations.  Respiratory: Negative for cough, hemoptysis, shortness of breath and snoring.   Endocrine: Negative for heat intolerance and polyphagia.  Hematologic/Lymphatic: Negative for bleeding problem. Does not bruise/bleed easily.  Skin: Negative for flushing, nail changes, rash and suspicious lesions.  Musculoskeletal: Negative for arthritis, joint pain, muscle cramps, myalgias, neck pain and stiffness.  Gastrointestinal: Negative for abdominal pain, bowel incontinence, diarrhea and excessive appetite.  Genitourinary: Negative for decreased libido, genital  sores and incomplete emptying.  Neurological: Negative for brief paralysis, focal weakness, headaches and loss of balance.  Psychiatric/Behavioral: Negative for altered mental status, depression and suicidal ideas.  Allergic/Immunologic: Negative for HIV exposure and persistent infections.    EKGs/Labs/Other Studies Reviewed:    The following studies were reviewed today:   EKG: None today  TTE 04/14/2021 IMPRESSIONS   1. Left ventricular ejection fraction, by estimation, is 60 to 65%. Left   ventricular ejection fraction by PLAX is 61 %. The left ventricle has  normal function. The left ventricle has no regional wall motion  abnormalities. Left ventricular diastolic  parameters were normal. The average left ventricular global longitudinal  strain is -25.2 %. The global longitudinal strain is normal.   2. Right ventricular systolic function is normal. The right ventricular  size is normal. There is normal pulmonary artery systolic pressure.   3. The mitral valve is normal in structure. No evidence of mitral valve  regurgitation. No evidence of mitral stenosis.   4. The aortic valve is tricuspid. Aortic valve regurgitation is not  visualized. No aortic stenosis is present.   5. The inferior vena cava is normal in size with greater than 50%  respiratory variability, suggesting right atrial pressure of 3 mmHg.   FINDINGS   Left Ventricle: Left ventricular ejection fraction, by estimation, is 60  to 65%. Left ventricular ejection fraction by PLAX is 61 %. The left  ventricle has normal function. The left ventricle has no regional wall  motion abnormalities. The average left  ventricular global longitudinal strain is -25.2 %. The global longitudinal  strain is normal. The left ventricular internal cavity size was normal in  size. There is no left ventricular hypertrophy. Left ventricular diastolic  parameters were normal.  Indeterminate filling pressures.   Right Ventricle: The right ventricular size is normal. No increase in  right ventricular wall thickness. Right ventricular systolic function is  normal. There is normal pulmonary artery systolic pressure. The tricuspid  regurgitant velocity is 1.15 m/s, and   with an assumed right atrial pressure of 3 mmHg, the estimated right  ventricular systolic pressure is 8.3 mmHg.   Left Atrium: Left atrial size was normal in size.   Right Atrium: Right atrial size was normal in size.   Pericardium: There is no evidence of  pericardial effusion.   Mitral Valve: The mitral valve is normal in structure. No evidence of  mitral valve regurgitation. No evidence of mitral valve stenosis.   Tricuspid Valve: The tricuspid valve is normal in structure. Tricuspid  valve regurgitation is trivial. No evidence of tricuspid stenosis.   Aortic Valve: The aortic valve is tricuspid. Aortic valve regurgitation is  not visualized. No aortic stenosis is present.   Pulmonic Valve: The pulmonic valve was normal in structure. Pulmonic valve  regurgitation is not visualized. No evidence of pulmonic stenosis.   Aorta: The aortic root is normal in size and structure.   Venous: The inferior vena cava is normal in size with greater than 50%  respiratory variability, suggesting right atrial pressure of 3 mmHg.   IAS/Shunts: No atrial level shunt detected by color flow Doppler.  Recent Labs: 12/25/2020: TSH 1.98 03/26/2021: BUN 16; Creatinine, Ser 0.77; Hemoglobin 12.8; Magnesium 1.8; NT-Pro BNP 283; Platelets 315; Potassium 3.7; Sodium 137  Recent Lipid Panel No results found for: CHOL, TRIG, HDL, CHOLHDL, VLDL, LDLCALC, LDLDIRECT  Physical Exam:    VS:  BP (!) 140/94    Pulse 77    Ht  $'4\' 11"'Q$  (1.499 m)    Wt 266 lb (120.7 kg)    SpO2 100%    BMI 53.73 kg/m     Wt Readings from Last 3 Encounters:  06/17/21 266 lb (120.7 kg)  03/25/21 263 lb (119.3 kg)  12/25/20 262 lb 9.6 oz (119.1 kg)     GEN: Well nourished, well developed in no acute distress HEENT: Normal NECK: No JVD; No carotid bruits LYMPHATICS: No lymphadenopathy CARDIAC: S1S2 noted,RRR, no murmurs, rubs, gallops RESPIRATORY:  Clear to auscultation without rales, wheezing or rhonchi  ABDOMEN: Soft, non-tender, non-distended, +bowel sounds, no guarding. EXTREMITIES: No edema, No cyanosis, no clubbing MUSCULOSKELETAL:  No deformity  SKIN: Warm and dry NEUROLOGIC:  Alert and oriented x 3, non-focal PSYCHIATRIC:  Normal affect, good insight  ASSESSMENT:    1.  Elevated blood pressure reading   2. Prediabetes   3. Morbid obesity ([place])    PLAN:     She does have an isolated reading for blood elevated blood pressure today.  Advised the patient about this.  She does not have a blood pressure cuff at home.  I advised her if she gets a blood pressure cuff at home to be able to get daily readings and share any information with [place].  She also is being referred to our pharmacy colleagues for weight loss hopefully at that visit we can also follow her blood pressure trend as well.  I advised the patient of her hemoglobin A1c which is 6.1 suggesting prediabetes.  I advised her measures that she can take to help prevent the onset of the diagnosis of diabetes mellitus.  She expresses understanding.  Once a week Lasix is helping with the leg edema we will continue the patient.  The patient is in agreement with the above plan. The patient left the office in stable condition.  The patient will follow up in 1 year or sooner if needed.   Medication Adjustments/Labs and Tests Ordered: Current medicines are reviewed at length with the patient today.  Concerns regarding medicines are outlined above.  Orders Placed This Encounter  Procedures   AMB Referral to [facility]   No orders of the defined types were placed in this encounter.   Patient Instructions  Medication Instructions:  Your physician recommends that you continue on your current medications as directed. Please refer to the Current Medication list given to you today.  *If you need a refill on your cardiac medications before your next appointment, please call your pharmacy*   Lab Work: None If you have labs (blood work) drawn today and your tests are completely normal, you will receive your results only by: [place] (if you have MyChart) OR A paper copy in the mail If you have any lab test that is abnormal or we need to change your treatment, we will call you to review the  results.   Testing/Procedures: None   Follow-Up: At [facility], you and your health needs are our priority.  As part of our continuing mission to provide you with exceptional heart care, we have created designated Provider Care Teams.  These Care Teams include your primary Cardiologist (physician) and Advanced Practice Providers (APPs -  Physician Assistants and Nurse Practitioners) who all work together to provide you with the care you need, when you need it.  We recommend signing up for the patient portal called "MyChart".  Sign up information is provided on this After Visit Summary.  MyChart is used to connect with patients for  Virtual Visits (Telemedicine).  Patients are able to view lab/test results, encounter notes, upcoming appointments, etc.  Non-urgent messages can be sent to your provider as well.   To learn more about what you can do with MyChart, go to [email or web address].    Your next appointment:   1 year(s)  The format for your next appointment:   In Person  Provider:   Berniece Salines, DO     Other Instructions  Diabetes Mellitus and Nutrition, Adult When you have diabetes, or diabetes mellitus, it is very important to have healthy eating habits because your blood sugar (glucose) levels are greatly affected by what you eat and drink. Eating healthy foods in the right amounts, at about the same times every day, can help you: Manage your blood glucose. Lower your risk of heart disease. Improve your blood pressure. Reach or maintain a healthy weight. What can affect my meal plan? Every person with diabetes is different, and each person has different needs for a meal plan. Your health care provider may recommend that you work with a dietitian to make a meal plan that is best for you. Your meal plan may vary depending on factors such as: The calories you need. The medicines you take. Your weight. Your blood glucose, blood pressure, and cholesterol levels. Your  activity level. Other health conditions you have, such as heart or kidney disease. How do carbohydrates affect me? Carbohydrates, also called carbs, affect your blood glucose level more than any other type of food. Eating carbs raises the amount of glucose in your blood. It is important to know how many carbs you can safely have in each meal. This is different for every person. Your dietitian can help you calculate how many carbs you should have at each meal and for each snack. How does alcohol affect me? Alcohol can cause a decrease in blood glucose (hypoglycemia), especially if you use insulin or take certain diabetes medicines by mouth. Hypoglycemia can be a life-threatening condition. Symptoms of hypoglycemia, such as sleepiness, dizziness, and confusion, are similar to symptoms of having too much alcohol. Do not drink alcohol if: Your health care provider tells you not to drink. You are pregnant, may be pregnant, or are planning to become pregnant. If you drink alcohol: Limit how much you have to: 0-1 drink a day for women. 0-2 drinks a day for men. Know how much alcohol is in your drink. In the [place] one drink equals one 12 oz bottle of beer (355 mL), one 5 oz glass of wine (148 mL), or one 1 oz glass of hard liquor (44 mL). Keep yourself hydrated with water, diet soda, or unsweetened iced tea. Keep in mind that regular soda, juice, and other mixers may contain a lot of sugar and must be counted as carbs. What are tips for following this plan? Reading food labels Start by checking the serving size on the Nutrition Facts label of packaged foods and drinks. The number of calories and the amount of carbs, fats, and other nutrients listed on the label are based on one serving of the item. Many items contain more than one serving per package. Check the total grams (g) of carbs in one serving. Check the number of grams of saturated fats and trans fats in one serving. Choose foods that have a low  amount or none of these fats. Check the number of milligrams (mg) of salt (sodium) in one serving. Most people should limit total sodium intake to less than 2,300 mg  per day. Always check the nutrition information of foods labeled as "low-fat" or "nonfat." These foods may be higher in added sugar or refined carbs and should be avoided. Talk to your dietitian to identify your daily goals for nutrients listed on the label. Shopping Avoid buying canned, pre-made, or processed foods. These foods tend to be high in fat, sodium, and added sugar. Shop around the outside edge of the grocery store. This is where you will most often find fresh fruits and vegetables, bulk grains, fresh meats, and fresh dairy products. Cooking Use low-heat cooking methods, such as baking, instead of high-heat cooking methods, such as deep frying. Cook using healthy oils, such as olive, canola, or sunflower oil. Avoid cooking with butter, cream, or high-fat meats. Meal planning Eat meals and snacks regularly, preferably at the same times every day. Avoid going long periods of time without eating. Eat foods that are high in fiber, such as fresh fruits, vegetables, beans, and whole grains. Eat 4-6 oz (112-168 g) of lean protein each day, such as lean meat, chicken, fish, eggs, or tofu. One ounce (oz) (28 g) of lean protein is equal to: 1 oz (28 g) of meat, chicken, or fish. 1 egg.  cup (62 g) of tofu. Eat some foods each day that contain healthy fats, such as avocado, nuts, seeds, and fish. What foods should I eat? Fruits Berries. Apples. Oranges. Peaches. Apricots. Plums. Grapes. Mangoes. Papayas. Pomegranates. Kiwi. Cherries. Vegetables Leafy greens, including lettuce, spinach, kale, chard, collard greens, mustard greens, and cabbage. Beets. Cauliflower. Broccoli. Carrots. Green beans. Tomatoes. Peppers. Onions. Cucumbers. Brussels sprouts. Grains Whole grains, such as whole-wheat or whole-grain bread, crackers,  tortillas, cereal, and pasta. Unsweetened oatmeal. Quinoa. Brown or wild rice. Meats and other proteins Seafood. Poultry without skin. Lean cuts of poultry and beef. Tofu. Nuts. Seeds. Dairy Low-fat or fat-free dairy products such as milk, yogurt, and cheese. The items listed above may not be a complete list of foods and beverages you can eat and drink. Contact a dietitian for more information. What foods should I avoid? Fruits Fruits canned with syrup. Vegetables Canned vegetables. Frozen vegetables with butter or cream sauce. Grains Refined white flour and flour products such as bread, pasta, snack foods, and cereals. Avoid all processed foods. Meats and other proteins Fatty cuts of meat. Poultry with skin. Breaded or fried meats. Processed meat. Avoid saturated fats. Dairy Full-fat yogurt, cheese, or milk. Beverages Sweetened drinks, such as soda or iced tea. The items listed above may not be a complete list of foods and beverages you should avoid. Contact a dietitian for more information. Questions to ask a health care provider Do I need to meet with a certified diabetes care and education specialist? Do I need to meet with a dietitian? What number can I call if I have questions? When are the best times to check my blood glucose? Where to find more information: American Diabetes Association: diabetes.org Academy of Nutrition and Dietetics: eatright.[employer] of Diabetes and Digestive and Kidney Diseases: [employer]en[employer].is Association of Diabetes Care & Education Specialists: diabeteseducator.org Summary It is important to have healthy eating habits because your blood sugar (glucose) levels are greatly affected by what you eat and drink. It is important to use alcohol carefully. A healthy meal plan will help you manage your blood glucose and lower your risk of heart disease. Your health care provider may recommend that you work with a dietitian to make a meal plan that is  best for you. This  information is not intended to replace advice given to you by your health care provider. Make sure you discuss any questions you have with your health care provider. Document Revised: 10/31/2019 Document Reviewed: 10/31/2019 Elsevier Patient Education  [place].    Adopting a Healthy Lifestyle.  Know what a healthy weight is for you (roughly BMI <25) and aim to maintain this   Aim for 7+ servings of fruits and vegetables daily   65-80+ fluid ounces of water or unsweet tea for healthy kidneys   Limit to max 1 drink of alcohol per day; avoid smoking/tobacco   Limit animal fats in diet for cholesterol and heart health - choose grass fed whenever available   Avoid highly processed foods, and foods high in saturated/trans fats   Aim for low stress - take time to unwind and care for your mental health   Aim for 150 min of moderate intensity exercise weekly for heart health, and weights twice weekly for bone health   Aim for 7-9 hours of sleep daily   When it comes to diets, agreement about the perfect plan isnt easy to find, even among the experts. Experts at the [employer] developed an idea known as the Healthy Eating Plate. Just imagine a plate divided into logical, healthy portions.   The emphasis is on diet quality:   Load up on vegetables and fruits - one-half of your plate: Aim for color and variety, and remember that potatoes dont count.   Go for whole grains - one-quarter of your plate: Whole wheat, barley, wheat berries, quinoa, oats, brown rice, and foods made with them. If you want pasta, go with whole wheat pasta.   Protein power - one-quarter of your plate: Fish, chicken, beans, and nuts are all healthy, versatile protein sources. Limit red meat.   The diet, however, does go beyond the plate, offering a few other suggestions.   Use healthy plant oils, such as olive, canola, soy, corn, sunflower and peanut. Check the  labels, and avoid partially hydrogenated oil, which have unhealthy trans fats.   If youre thirsty, drink water. Coffee and tea are good in moderation, but skip sugary drinks and limit milk and dairy products to one or two daily servings.   The type of carbohydrate in the diet is more important than the amount. Some sources of carbohydrates, such as vegetables, fruits, whole grains, and beans-are healthier than others.   Finally, stay active  Signed, Berniece Salines, DO  06/18/2021 4:33 PM    [facility]

## 2021-06-18 ENCOUNTER — Encounter: Payer: Self-pay | Admitting: Cardiology

## 2021-07-09 ENCOUNTER — Ambulatory Visit (INDEPENDENT_AMBULATORY_CARE_PROVIDER_SITE_OTHER): Payer: BC Managed Care – PPO | Admitting: Pharmacist Clinician (PhC)/ Clinical Pharmacy Specialist

## 2021-07-09 ENCOUNTER — Encounter: Payer: Self-pay | Admitting: Pharmacist Clinician (PhC)/ Clinical Pharmacy Specialist

## 2021-07-09 VITALS — BP 154/98 | HR 80 | Resp 17 | Ht 59.0 in | Wt 264.4 lb

## 2021-07-09 DIAGNOSIS — I10 Essential (primary) hypertension: Secondary | ICD-10-CM

## 2021-07-09 DIAGNOSIS — R030 Elevated blood-pressure reading, without diagnosis of hypertension: Secondary | ICD-10-CM

## 2021-07-09 MED ORDER — OLMESARTAN MEDOXOMIL 20 MG PO TABS: 20.0000 mg | ORAL_TABLET | Freq: Every day | ORAL | 3 refills | Status: DC

## 2021-07-09 MED ORDER — POTASSIUM CHLORIDE ER 10 MEQ PO TBCR: EXTENDED_RELEASE_TABLET | ORAL | 3 refills | Status: DC

## 2021-07-09 MED ORDER — FUROSEMIDE 20 MG PO TABS: 20.0000 mg | ORAL_TABLET | ORAL | 3 refills | Status: DC

## 2021-07-09 NOTE — Patient Instructions (Signed)
Return for a a follow up appointment Thursday May 11 at 3 pm  ? ?Go to the lab in 2 weeks (around April 13) to check kidney function and electrolytes ? ?Look at getting an [employer] home BP cuff - should be at [facility] ? ?Take your BP meds as follows: ? Start olmesartan 20 mg once daily ? ?Bring all of your meds, your BP cuff and your record of home blood pressures to your next appointment.  Exercise as you?re able, try to walk approximately 30 minutes per day.  Keep salt intake to a minimum, especially watch canned and prepared boxed foods.  Eat more fresh fruits and vegetables and fewer canned items.  Avoid eating in fast food restaurants.  ? ? HOW TO TAKE YOUR BLOOD PRESSURE: ?Rest 5 minutes before taking your blood pressure. ? Don?t smoke or drink caffeinated beverages for at least 30 minutes before. ?Take your blood pressure before (not after) you eat. ?Sit comfortably with your back supported and both feet on the floor (don?t cross your legs). ?Elevate your arm to heart level on a table or a desk. ?Use the proper sized cuff. It should fit smoothly and snugly around your bare upper arm. There should be enough room to slip a fingertip under the cuff. The bottom edge of the cuff should be 1 inch above the crease of the elbow. ?Ideally, take 3 measurements at one sitting and record the average. ? ?We will start the prior authorization process to get [employer] covered by your insurance.  ? ?TIPS FOR SUCCESS ?Write down the reasons why you want to lose weight and post it in a place where you'll see it often. ?Start small and work your way up. Keep in mind that it takes time to achieve goals, and small steps add up. ?Any additional movements help to burn calories. Taking the stairs rather than the elevator and parking at the far end of your parking lot are easy ways to start. Brisk walking for at least 30 minutes 4 or more days of the week is an excellent goal to work toward ? ?UNDERSTANDING WHAT IT MEANS TO FEEL  FULL ?Did you know that it can take 15 minutes or more for your brain to receive the message that you've eaten? That means that, if you eat less food, but consume it slower, you may still feel satisfied. ?Eating a lot of fruits and vegetables can also help you feel fuller. ?Eat off of smaller plates so that moderate portions don't seem too small ? ?TITRATION PLAN ?Will plan to follow the titration plan as below, pending patient is tolerating each dose before increasing to the next. Can slow titration if needed for tolerability.  ?  ?-Weeks 1-4: Inject 0.25 mg SQ once weekly  ?-Weeks 5-8: Inject 0.5 mg SQ once weekly  ?-Weeks 9-12 Inject 1 mg SQ once weekly  ? ?If you have any questions or concerns, please reach out to [place].  Lariza Cothron/Chris at [phone]. ? ?I will reach out to [facility] with the PREP exercise program to get in touch with you.  ? ?[place]  ? ?

## 2021-07-09 NOTE — Progress Notes (Signed)
HPI: ?Kristina Khan is a 41 y.o. female patient referred to pharmacy clinic by Dr. Harriet Masson for hypertension management and initiate weight loss therapy with GLP1-RA.  Most recent BMI 53.4.  She was seen by Dr. Harriet Masson earlier this month and it was noted she had an A1c of 6.1.  She had a gastric sleeve placed in 2017 and managed to get her weight down to 202, but then the pandemic hit and she was unable to continue with her exercise.  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  She notes that she has never taken any medications for high blood pressure in the past.   ? ?Today she is in the office to discuss options for weight loss as well as concerns for hypertension. Her medical history is significant only for Hashimoto's thyroiditis with left thyroid nodule and pre-diabetes.   ? ?Current weight management medications: none ? ?Current hypertension medications: none ? ?Previously tried meds: gastric sleeve; [facility] - they told her it was thyroid issues; Contrave (naltrxone/bupropion) ? ?Current meds that may affect weight: none ? ?Baseline weight/BMI: 119.9 kg  BMI 53.4 ? ?Insurance payor: state employees health plan ? ?Diet: mix of home and out, about 85% at home; from scratch, does use salt with cooking; mix of proteins; gastric sleeve in 2017, so tries to eat high protein and small meals. Snacks on  chips, popcorn; admits to sweet tooth and drinking sweet tea ? ?Exercise: walking throughout building, has knee issues, arthritis in both, limiting mobility - has exercise bike now, wants to start routine; has [employer], wants to get to water aerobics ? ?Family History: both parents with hypertension, and CHF, father long Covid, now mom 61, dad 16; 2 siblings, not sure how they are; 1 teenage daughter ? ?Confirmed patient not pregnant  and no personal or family history of medullary thyroid carcinoma (MTC) or Multiple Endocrine Neoplasia syndrome type 2 (MEN 2).  ? ?Social History: no tobacco ? ?Labs: ?Lab Results  ?Component Value Date  ? HGBA1C 6.1 (H) 03/26/2021  ? ? ?Wt Readings from Last 1 Encounters:  ?07/09/21 264 lb 6.4 oz (119.9 kg)  ? ? ?BP Readings from Last 1 Encounters:  ?07/09/21 (!) 154/98  ? ?Pulse Readings from Last 1 Encounters:  ?07/09/21 80  ? ? ?No results found for: CHOL, TRIG, HDL, CHOLHDL, VLDL, LDLCALC, LDLDIRECT ? ?Past Medical History:  ?Diagnosis Date  ? Hashimoto's thyroiditis   ? Obesity   ? Prediabetes   ? ? ?Current Outpatient Medications on File Prior to Visit  ?Medication Sig Dispense Refill  ? BIOTIN PO Take by mouth 3 (three) times a week.    ? esomeprazole (NEXIUM) 40 MG capsule Take 40 mg by mouth daily.    ? gabapentin (NEURONTIN) 300 MG capsule Take 300 mg by mouth daily.    ? ?No current facility-administered medications on file prior to visit.  ? ? ?Allergies  ?Allergen Reactions  ? Shellfish Allergy Anaphylaxis  ? Penicillins Rash  ? ? ?Hypertension ?Patient with newer onset hypertension, noticeably both systolic and diastolic.  Reviewed options and discussed treatment versus lifestyle modification.  Would prefer to start medication at this time with the knowledge that if she is to lose significant weight, we can easily decrease dose or stop medication as  her BP hopefully drops in tandem with weight.  Will start olmesartan 20 mg daily and have her get metabolic panel in 2 weeks.  Will see her back in 6 weeks for BP follow up.  She was encouraged to get a home BP cuff, explained that [facility] have good quality Omron for $30.   ? ?Obesity, morbid, BMI 50 or higher ([place]) ?Patient has not met goal of at least 5% of body weight loss with comprehensive lifestyle modifications alone in the past 3-6 months. Pharmacotherapy is appropriate to pursue as augmentation. Will plan to start [facility].  ? ?Confirmed  patient not pregnant and no personal or family history of medullary thyroid carcinoma (MTC) or Multiple Endocrine Neoplasia syndrome type 2 (MEN 2).  ? ?Advised patient on common side effects including nausea, diarrhea, dyspepsia, decreased appetite, and fatigue. Counseled patient on reducing meal size and how to titrate medication to minimize side effects. Patient aware to call if intolerable side effects or if experiencing dehydration, abdominal pain, or dizziness. Patient will adhere to dietary modifications and will target at least 150 minutes of moderate intensity exercise weekly.  ? ?Injection technique reviewed at today's visit. ? ?Titration Plan:  ?Will plan to follow the titration plan as below, pending patient is tolerating each dose before increasing to the next. Can slow titration if needed for tolerability.  ?  ?-Month 1: Inject 0.25 mg SQ once weekly x 4 weeks ?-Month 2: Inject 0.5 mg SQ once weekly x 4 weeks ?-Month 3: Inject 1 mg SQ once weekly x 4 weeks ?-Month 4+: Inject 1.7 mg SQ once weekly  ? ?Follow up in 3 months.  . ? ? ? ?Tommy Medal PharmD CPP [facility] ?[place] ?[place], [place] [address] ?[phone] ? ?

## 2021-07-10 ENCOUNTER — Encounter: Payer: Self-pay | Admitting: Pharmacist Clinician (PhC)/ Clinical Pharmacy Specialist

## 2021-07-10 DIAGNOSIS — I10 Essential (primary) hypertension: Secondary | ICD-10-CM | POA: Insufficient documentation

## 2021-07-10 NOTE — Assessment & Plan Note (Signed)
Patient with newer onset hypertension, noticeably both systolic and diastolic.  Reviewed options and discussed treatment versus lifestyle modification.  Would prefer to start medication at this time with the knowledge that if she is to lose significant weight, we can easily decrease dose or stop medication as her BP hopefully drops in tandem with weight.  Will start olmesartan 20 mg daily and have her get metabolic panel in 2 weeks.  Will see her back in 6 weeks for BP follow up.  She was encouraged to get a home BP cuff, explained that [facility] have good quality Omron for $30.   ?

## 2021-07-10 NOTE — Assessment & Plan Note (Signed)
Patient has not met goal of at least 5% of body weight loss with comprehensive lifestyle modifications alone in the past 3-6 months. Pharmacotherapy is appropriate to pursue as augmentation. Will plan to start [facility].  ? ?Confirmed patient not pregnant and no personal or family history of medullary thyroid carcinoma (MTC) or Multiple Endocrine Neoplasia syndrome type 2 (MEN 2).  ? ?Advised patient on common side effects including nausea, diarrhea, dyspepsia, decreased appetite, and fatigue. Counseled patient on reducing meal size and how to titrate medication to minimize side effects. Patient aware to call if intolerable side effects or if experiencing dehydration, abdominal pain, or dizziness. Patient will adhere to dietary modifications and will target at least 150 minutes of moderate intensity exercise weekly.  ? ?Injection technique reviewed at today's visit. ? ?Titration Plan:  ?Will plan to follow the titration plan as below, pending patient is tolerating each dose before increasing to the next. Can slow titration if needed for tolerability.  ?  ?-Month 1: Inject 0.25 mg SQ once weekly x 4 weeks ?-Month 2: Inject 0.5 mg SQ once weekly x 4 weeks ?-Month 3: Inject 1 mg SQ once weekly x 4 weeks ?-Month 4+: Inject 1.7 mg SQ once weekly  ? ?Follow up in 3 months.  . ? ?

## 2021-07-15 ENCOUNTER — Other Ambulatory Visit (HOSPITAL_BASED_OUTPATIENT_CLINIC_OR_DEPARTMENT_OTHER): Payer: Self-pay | Admitting: Pharmacist Clinician (PhC)/ Clinical Pharmacy Specialist

## 2021-07-15 ENCOUNTER — Encounter: Payer: Self-pay | Admitting: Pharmacist Clinician (PhC)/ Clinical Pharmacy Specialist

## 2021-07-15 MED ORDER — SEMAGLUTIDE-WEIGHT MANAGEMENT 0.25 MG/0.5ML ~~LOC~~ SOAJ: 0.2500 mg | SUBCUTANEOUS | 0 refills | Status: DC

## 2021-07-15 MED ORDER — SEMAGLUTIDE-WEIGHT MANAGEMENT 1 MG/0.5ML ~~LOC~~ SOAJ: 1.0000 mg | SUBCUTANEOUS | 0 refills | Status: DC

## 2021-07-15 MED ORDER — SEMAGLUTIDE-WEIGHT MANAGEMENT 2.4 MG/0.75ML ~~LOC~~ SOAJ
2.4000 mg | SUBCUTANEOUS | 0 refills | Status: AC
  Filled 2022-02-17: qty 3, 28d supply, fill #0

## 2021-07-15 MED ORDER — SEMAGLUTIDE-WEIGHT MANAGEMENT 1.7 MG/0.75ML ~~LOC~~ SOAJ
1.7000 mg | SUBCUTANEOUS | 0 refills | Status: DC
  Filled 2021-10-20: qty 3, 28d supply, fill #0

## 2021-07-15 MED ORDER — SEMAGLUTIDE-WEIGHT MANAGEMENT 0.5 MG/0.5ML ~~LOC~~ SOAJ: 0.5000 mg | SUBCUTANEOUS | 0 refills | Status: DC

## 2021-08-19 ENCOUNTER — Encounter: Payer: Self-pay | Admitting: Emergency Medicine

## 2021-08-19 ENCOUNTER — Ambulatory Visit
Admission: EM | Admit: 2021-08-19 | Discharge: 2021-08-19 | Disposition: A | Discharge status: Home / Self Care | Payer: BC Managed Care – PPO | Attending: Physician Assistant | Admitting: Physician Assistant

## 2021-08-19 ENCOUNTER — Other Ambulatory Visit: Payer: Self-pay

## 2021-08-19 DIAGNOSIS — R1010 Upper abdominal pain, unspecified: Secondary | ICD-10-CM | POA: Diagnosis not present

## 2021-08-19 LAB — POCT URINALYSIS DIP (MANUAL ENTRY)
Bilirubin, UA: NEGATIVE
Blood, UA: NEGATIVE
Glucose, UA: NEGATIVE mg/dL
Ketones, POC UA: NEGATIVE mg/dL
Leukocytes, UA: NEGATIVE
Nitrite, UA: NEGATIVE
Protein Ur, POC: 30 mg/dL — AB
Spec Grav, UA: 1.025 (ref 1.010–1.025)
Urobilinogen, UA: 0.2 E.U./dL
pH, UA: 7 (ref 5.0–8.0)

## 2021-08-19 LAB — POCT URINE PREGNANCY: Preg Test, Ur: NEGATIVE

## 2021-08-19 MED ORDER — SUCRALFATE 1 G PO TABS: 1.0000 g | ORAL_TABLET | Freq: Three times a day (TID) | ORAL | 0 refills | Status: DC

## 2021-08-19 MED ORDER — ALUM & MAG HYDROXIDE-SIMETH 200-200-20 MG/5ML PO SUSP
30.0000 mL | Freq: Once | ORAL | Status: AC
  Administered 2021-08-19: 30 mL via ORAL

## 2021-08-19 MED ORDER — LIDOCAINE VISCOUS HCL 2 % MT SOLN
15.0000 mL | Freq: Once | OROMUCOSAL | Status: AC
  Administered 2021-08-19: 15 mL via ORAL

## 2021-08-19 NOTE — ED Provider Notes (Signed)
?[facility] ? ? ? ?CSN: [id number] ?Arrival date & time: 08/19/21  1449 ? ? ?  ? ?History   ?Chief Complaint ?Chief Complaint  ?Patient presents with  ? Abdominal Pain  ? ? ?HPI ?Kristina Khan is a 41 y.o. female.  ? ?Patient presents today with a several week history of intermittent upper and right-sided abdominal pain.  She reports pain has become more persistent and severe prompting evaluation.  She reports pain is rated 7.5 on a 0-10 pain scale, localized to upper abdomen and right side of abdomen, described as aching, no aggravating leaving factors identified.  She denies any nausea, vomiting, melena, hematochezia, urinary symptoms.  She is status postcholecystectomy and has had sleeve gastrectomy as well as hernia repair.  Still has appendix.  She has tried a laxative thinking that constipation could be contributing symptoms but did not have any improvement.  Reports last bowel movement was yesterday and was normal.  She is currently taking Wegovy for weight loss but denies any recent dose adjustments prior to symptom onset; has been on this for a few months.  She does not drink alcohol.  Does not take NSAIDs on a regular basis. ? ? ?Past Medical History:  ?Diagnosis Date  ? Hashimoto's thyroiditis   ? Obesity   ? Prediabetes   ? ? ?Patient Active Problem List  ? Diagnosis Date Noted  ? Hypertension 07/10/2021  ? Obesity, morbid, BMI 50 or higher ([place]) 07/10/2021  ? 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 ? ?History reviewed. No pertinent surgical history. ? ?OB History   ?No obstetric history on file. ?  ? ? ? ?Home Medications   ? ?Prior to Admission medications   ?Medication Sig Start Date End Date Taking? Authorizing Provider  ?sucralfate (CARAFATE) 1 g tablet Take 1 tablet (1 g total) by mouth 4 (four) times daily -  with meals and at bedtime. 08/19/21  Yes Brewer Hitchman K, PA-C  ?BIOTIN PO Take by mouth 3 (three) times a week.    [provider]  ?esomeprazole (NEXIUM) 40 MG capsule  Take 40 mg by mouth daily. 06/21/19   [provider]  ?furosemide (LASIX) 20 MG tablet Take 1 tablet (20 mg total) by mouth once a week. 07/09/21 10/07/21  Tobb, Godfrey Pick, DO  ?gabapentin (NEURONTIN) 300 MG capsule Take 300 mg by mouth daily. 06/21/19   [provider]  ?olmesartan (BENICAR) 20 MG tablet Take 1 tablet (20 mg total) by mouth daily. 07/09/21   Tobb, Kardie, DO  ?potassium chloride (KLOR-CON) 10 MEQ tablet Take 1 tablet by mouth weekly with furosemide dose. 07/09/21   Berniece Salines, DO  ?Semaglutide-Weight Management 0.25 MG/0.5ML SOAJ Inject 0.25 mg into the skin once a week. 07/15/21   Berniece Salines, DO  ?Semaglutide-Weight Management 0.5 MG/0.5ML SOAJ Inject 0.5 mg into the skin once a week. 07/15/21   Berniece Salines, DO  ?Semaglutide-Weight Management 1 MG/0.5ML SOAJ Inject 1 mg into the skin once a week. 07/15/21   Berniece Salines, DO  ?Semaglutide-Weight Management 1.7 MG/0.75ML SOAJ Inject 1.7 mg into the skin once a week. 07/15/21   Berniece Salines, DO  ?Semaglutide-Weight Management 2.4 MG/0.75ML SOAJ Inject 2.4 mg into the skin once a week. 07/15/21   Tobb, Godfrey Pick, DO  ? ? ?Family History ?History reviewed. No pertinent family history. ? ?Social History ?Social History  ? ?Tobacco Use  ? Smoking status: Never  ? Smokeless tobacco: Never  ? ? ? ?Allergies   ?Shellfish allergy and Penicillins ? ? ?  Review of Systems ?Review of Systems  ?Constitutional:  Positive for activity change. Negative for appetite change, fatigue and fever.  ?Respiratory:  Negative for cough and shortness of breath.   ?Cardiovascular:  Negative for chest pain.  ?Gastrointestinal:  Positive for abdominal pain. Negative for blood in stool, constipation, diarrhea, nausea and vomiting.  ?Genitourinary:  Negative for dysuria, frequency, urgency, vaginal bleeding, vaginal discharge and vaginal pain.  ?Neurological:  Negative for dizziness, light-headedness and headaches.  ? ? ?Physical Exam ?Triage Vital Signs ?ED Triage Vitals  ?Enc  Vitals Group  ?   BP 08/19/21 1536 136/80  ?   Pulse Rate 08/19/21 1536 91  ?   Resp 08/19/21 1536 18  ?   Temp 08/19/21 1536 97.9 ?F (36.6 ?C)  ?   Temp Source 08/19/21 1536 Oral  ?   SpO2 08/19/21 1536 98 %  ?   Weight --   ?   Height --   ?   Head Circumference --   ?   Peak Flow --   ?   Pain Score 08/19/21 1537 6  ?   Pain Loc --   ?   Pain Edu? --   ?   Excl. in [place]? --   ? ?No data found. ? ?Updated Vital Signs ?BP 136/80 (BP Location: Left Arm)   Pulse 91   Temp 97.9 ?F (36.6 ?C) (Oral)   Resp 18   SpO2 98%  ? ?Visual Acuity ?Right Eye Distance:   ?Left Eye Distance:   ?Bilateral Distance:   ? ?Right Eye Near:   ?Left Eye Near:    ?Bilateral Near:    ? ?Physical Exam ?Vitals reviewed.  ?Constitutional:   ?   General: She is awake. She is not in acute distress. ?   Appearance: Normal appearance. She is well-developed. She is not ill-appearing.  ?   Comments: Very pleasant female appears stated age in no acute distress sitting comfortably in exam room  ?HENT:  ?   Head: Normocephalic and atraumatic.  ?Cardiovascular:  ?   Rate and Rhythm: Normal rate and regular rhythm.  ?   Heart sounds: Normal heart sounds, S1 normal and S2 normal. No murmur heard. ?Pulmonary:  ?   Effort: Pulmonary effort is normal.  ?   Breath sounds: Normal breath sounds. No wheezing, rhonchi or rales.  ?   Comments: Clear to auscultation bilaterally ?Abdominal:  ?   General: Bowel sounds are normal.  ?   Palpations: Abdomen is soft.  ?   Tenderness: There is abdominal tenderness in the right upper quadrant and epigastric area. There is no right CVA tenderness, left CVA tenderness, guarding or rebound.  ?   Comments: Tenderness to palpation in right upper quadrant and epigastrium.  No evidence of acute abdomen on physical exam.  ?Psychiatric:     ?   Behavior: Behavior is cooperative.  ? ? ? ?UC Treatments / Results  ?Labs ?(all labs ordered are listed, but only abnormal results are displayed) ?Labs Reviewed  ?POCT URINALYSIS DIP  (MANUAL ENTRY) - Abnormal; Notable for the following components:  ?    Result Value  ? Protein Ur, POC =30 (*)   ? All other components within normal limits  ?CBC WITH DIFFERENTIAL/PLATELET  ?COMPREHENSIVE METABOLIC PANEL  ?LIPASE  ?POCT URINE PREGNANCY  ? ? ?EKG ? ? ?Radiology ?No results found. ? ?Procedures ?Procedures (including critical care time) ? ?Medications Ordered in UC ?Medications  ?alum & mag hydroxide-simeth (MAALOX/MYLANTA) 200-200-20 MG/5ML  suspension 30 mL (30 mLs Oral Given 08/19/21 1556)  ?  And  ?lidocaine (XYLOCAINE) 2 % viscous mouth solution 15 mL (15 mLs Oral Given 08/19/21 1556)  ? ? ?Initial Impression / Assessment and Plan / UC Course  ?I have reviewed the triage vital signs and the nursing notes. ? ?Pertinent labs & imaging results that were available during my care of the patient were reviewed by me and considered in my medical decision making (see chart for details). ? ?  ? ?Patient is well-appearing, afebrile, nontoxic, nontachycardic.  Vital signs and physical exam reassuring today; no indication for emergent evaluation or imaging.  Urine pregnancy was negative.  UA was appropriate.  Patient was given GI cocktail with resolution of symptoms.  Discussed this is more concerning for upper GI syndrome such as peptic ulcer disease.  Recommended that she follow-up with GI specialist was given contact information for local provider with instruction to call to schedule an appointment.  CBC, CMP, lipase obtained today-results pending.  We will contact patient if need to change treatment plan based on these results.  She was encouraged to avoid NSAIDs, alcohol, spicy/acidic foods.  She is to rest and drink plenty of fluid and eat a bland diet.  She was prescribed Carafate and encouraged to continue previously prescribed PPI.  Discussed that if she has any worsening symptoms including severe abdominal pain, weakness, nausea, vomiting, melena, hematochezia she needs to go to the emergency room to  which she expressed understanding.  Strict return precautions given.  Work excuse note provided. ? ?Final Clinical Impressions(s) / UC Diagnoses  ? ?Final diagnoses:  ?Upper abdominal pain  ? ? ? ?Discharge Instruc

## 2021-08-19 NOTE — Discharge Instructions (Signed)
I am glad you are feeling better after the medication.  Please avoid spicy/acidic/fatty foods.  Avoid alcohol and NSAIDs (aspirin, ibuprofen/Advil, naproxen/Aleve).  Use Carafate before each meal and before bed.  Continue your Nexium as previously prescribed.  It is important that you follow-up with GI.  Call to schedule an appointment soon as possible.  I will contact you with your lab work if it changes our treatment plan.  If you have any worsening symptoms including recurrent severe pain, blood in your stool, nausea/vomiting, chest pain, shortness of breath, weakness you need to go to the emergency room immediately. ?

## 2021-08-19 NOTE — ED Triage Notes (Signed)
Pt here for right sided abd pain x 3 days worse with movement and started more toward umbilical area  ?

## 2021-08-20 ENCOUNTER — Encounter: Payer: Self-pay | Admitting: Pharmacist Clinician (PhC)/ Clinical Pharmacy Specialist

## 2021-08-20 ENCOUNTER — Ambulatory Visit: Payer: BC Managed Care – PPO | Admitting: Pharmacist Clinician (PhC)/ Clinical Pharmacy Specialist

## 2021-08-20 DIAGNOSIS — I10 Essential (primary) hypertension: Secondary | ICD-10-CM | POA: Diagnosis not present

## 2021-08-20 LAB — CBC WITH DIFFERENTIAL/PLATELET
Basophils Absolute: 0.1 10*3/uL (ref 0.0–0.2)
Basos: 1 %
EOS (ABSOLUTE): 0.1 10*3/uL (ref 0.0–0.4)
Eos: 1 %
Hematocrit: 40.9 % (ref 34.0–46.6)
Hemoglobin: 13.4 g/dL (ref 11.1–15.9)
Immature Grans (Abs): 0 10*3/uL (ref 0.0–0.1)
Immature Granulocytes: 0 %
Lymphocytes Absolute: 3 10*3/uL (ref 0.7–3.1)
Lymphs: 29 %
MCH: 27.1 pg (ref 26.6–33.0)
MCHC: 32.8 g/dL (ref 31.5–35.7)
MCV: 83 fL (ref 79–97)
Monocytes Absolute: 0.8 10*3/uL (ref 0.1–0.9)
Monocytes: 8 %
Neutrophils Absolute: 6.3 10*3/uL (ref 1.4–7.0)
Neutrophils: 61 %
Platelets: 287 10*3/uL (ref 150–450)
RBC: 4.95 x10E6/uL (ref 3.77–5.28)
RDW: 13.1 % (ref 11.7–15.4)
WBC: 10.3 10*3/uL (ref 3.4–10.8)

## 2021-08-20 LAB — COMPREHENSIVE METABOLIC PANEL
ALT: 13 IU/L (ref 0–32)
AST: 16 IU/L (ref 0–40)
Albumin/Globulin Ratio: 1.5 (ref 1.2–2.2)
Albumin: 4.4 g/dL (ref 3.8–4.8)
Alkaline Phosphatase: 87 IU/L (ref 44–121)
BUN/Creatinine Ratio: 21 (ref 9–23)
BUN: 17 mg/dL (ref 6–24)
Bilirubin Total: <0.2 mg/dL (ref 0.0–1.2)
CO2: 23 mmol/L (ref 20–29)
Calcium: 9.4 mg/dL (ref 8.7–10.2)
Chloride: 99 mmol/L (ref 96–106)
Creatinine, Ser: 0.8 mg/dL (ref 0.57–1.00)
Globulin, Total: 2.9 g/dL (ref 1.5–4.5)
Glucose: 90 mg/dL (ref 70–99)
Potassium: 4.5 mmol/L (ref 3.5–5.2)
Sodium: 137 mmol/L (ref 134–144)
Total Protein: 7.3 g/dL (ref 6.0–8.5)
eGFR: 94 mL/min/{1.73_m2} (ref >59)

## 2021-08-20 LAB — LIPASE: Lipase: 32 U/L (ref 14–72)

## 2021-08-20 NOTE — Assessment & Plan Note (Signed)
Patient started [facility] 5 weeks ago, will take second dose of 0.5 mg this coming weekend.  No issues with N/V, but has developed some URQ pain.  Went to Urgent Care yesterday and has follow up with her GI MD on Monday.  She will keep [place] advised if they determine Wegovy cause of problem.  Otherwise she will continue up-titrating medication every 4 weeks and we will see her back in 3 months for follow up.  ?

## 2021-08-20 NOTE — Progress Notes (Signed)
HPI: ?Kristina Khan is a 41 y.o. female patient referred to pharmacy clinic by Dr. Harriet Masson for hypertension management and initiate weight loss therapy with GLP1-RA.  Most recent BMI 53.4.  She was seen by Dr. Harriet Masson earlier this month and it was noted she had an A1c of 6.1.  She had a gastric sleeve placed in 2017 and managed to get her weight down to 202, but then the pandemic hit and she was unable to continue with her exercise.  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  She notes that she has never taken any medications for high blood pressure in the past.   ? ?Today she is in the office to discuss options for weight loss as well as concerns for hypertension. Her medical history is significant only for Hashimoto's thyroiditis with left thyroid nodule and pre-diabetes.   ? ?On 0.5 mg Wegovy currently, started on Sunday ?Had some pain in upper right quadrant, showed high protein in urine ?Seeing GI tomorrow.  Started carafate (has previously had gastric and esophageal ulcers after gastric sleve surgery) ? ?BP - checked for about a week  154/98 whe here 3/30 ?Yesterday 136/80 at [facility] ? ?Current weight management medications: none ? ?Current hypertension medications: none ? ?Previously tried meds: gastric sleeve; [facility] - they told her it was thyroid issues; Contrave (naltrxone/bupropion) ? ?Current meds that may affect weight: none ? ?Baseline weight/BMI: 119.9 kg  BMI 53.4 ? ?Insurance payor: state employees health plan ? ?Diet: mix of home and out, about 85% at home; from scratch, does use salt with cooking; mix of proteins; gastric sleeve in 2017, so tries to eat high protein and small meals. Snacks on  chips, popcorn; admits to sweet tooth and drinking sweet tea ? ?Exercise: walking throughout building, has knee issues, arthritis  in both, limiting mobility - has exercise bike now, wants to start routine; has [employer], wants to get to water aerobics ? ?Family History: both parents with hypertension, and CHF, father long Covid, now mom 69, dad 34; 2 siblings, not sure how they are; 1 teenage daughter ? ?Confirmed patient not pregnant and no personal or family history of medullary thyroid carcinoma (MTC) or Multiple Endocrine Neoplasia syndrome type 2 (MEN 2).  ? ?Social History: no tobacco ? ?Labs: ?Lab Results  ?Component Value Date  ? HGBA1C 6.1 (H) 03/26/2021  ? ? ?Wt Readings from Last 1 Encounters:  ?08/20/21 256 lb 3.2 oz (116.2 kg)  ? ? ?BP Readings from Last 1 Encounters:  ?08/20/21 111/78  ? ?Pulse Readings from Last 1 Encounters:  ?08/20/21 98  ? ? ?No results found for: CHOL, TRIG, HDL, CHOLHDL, VLDL, LDLCALC, LDLDIRECT ? ?Past Medical History:  ?Diagnosis Date  ? Hashimoto's thyroiditis   ? Obesity   ? Prediabetes   ? ? ?Current Outpatient Medications on File Prior to Visit  ?Medication Sig Dispense Refill  ? BIOTIN PO Take by mouth 3 (three) times a week.    ? esomeprazole (NEXIUM) 40 MG capsule Take 40 mg by mouth daily.    ? furosemide (LASIX) 20 MG tablet Take 1 tablet (20 mg total) by mouth once a week. 13 tablet 3  ? gabapentin (NEURONTIN) 300 MG capsule Take 300 mg by mouth daily.    ? olmesartan (BENICAR)  20 MG tablet Take 1 tablet (20 mg total) by mouth daily. 30 tablet 3  ? potassium chloride (KLOR-CON) 10 MEQ tablet Take 1 tablet by mouth weekly with furosemide dose. 13 tablet 3  ? Semaglutide-Weight Management 0.25 MG/0.5ML SOAJ Inject 0.25 mg into the skin once a week. 2 mL 0  ? Semaglutide-Weight Management 0.5 MG/0.5ML SOAJ Inject 0.5 mg into the skin once a week. 2 mL 0  ? Semaglutide-Weight Management 1 MG/0.5ML SOAJ Inject 1 mg into the skin once a week. 2 mL 0  ? Semaglutide-Weight Management 1.7 MG/0.75ML SOAJ Inject 1.7 mg into the skin once a week. 3 mL 0  ? Semaglutide-Weight Management 2.4 MG/0.75ML  SOAJ Inject 2.4 mg into the skin once a week. 3 mL 0  ? sucralfate (CARAFATE) 1 g tablet Take 1 tablet (1 g total) by mouth 4 (four) times daily -  with meals and at bedtime. 28 tablet 0  ? ?No current facility-administered medications on file prior to visit.  ? ? ?Allergies  ?Allergen Reactions  ? Shellfish Allergy Anaphylaxis  ? Penicillins Rash  ? ? ?Hypertension ?Patient with essential hypertension, much improved on olmesartan 20 mg daily.  Metabolic panel drawn yesterday shows electrolytes and SCr stable.  Will have patient continue with daily olmesartan 20 mg.  She should check home BP readings a few times each week and report any concerns.  ? ?Obesity, morbid, BMI 50 or higher ([place]) ?Patient started [facility] 5 weeks ago, will take second dose of 0.5 mg this coming weekend.  No issues with N/V, but has developed some URQ pain.  Went to Urgent Care yesterday and has follow up with her GI MD on Monday.  She will keep [place] advised if they determine Wegovy cause of problem.  Otherwise she will continue up-titrating medication every 4 weeks and we will see her back in 3 months for follow up.  ? ? ? ?Tommy Medal PharmD CPP [facility] ?[address] ?[address] ?[place], [place] [address] ?[phone] ? ?

## 2021-08-20 NOTE — Progress Notes (Signed)
? ? ? ?08/21/2021 ?Kristina Khan ?[id number] ?10/27/1980 ? ?Referring provider: Chaney Malling, PA ?Primary GI doctor: Dr. Lorenso Courier ? ?ASSESSMENT AND PLAN:  ? ?Pain of upper abdomen ?-     US ABDOMEN LIMITED RUQ (LIVER/GB); Future ?-     H. pylori antibody, IgG; Future ?-     esomeprazole (NEXIUM) 40 MG capsule; Take 1 capsule (40 mg total) by mouth 2 (two) times daily before a meal. ?-     Comprehensive metabolic panel; Future ?-     hyoscyamine (OSCIMIN) 0.125 MG tablet; Take 1 tablet (0.125 mg total) by mouth every 6 (six) hours as needed for cramping. ?Patient status post cholecystectomy but pain is more centered right upper quadrant with some radiation to back had a normal lipase.  We will schedule for right upper quadrant ultrasound to evaluate biliary tree though patient has no signs of obstruction on recent labs ?We will schedule for endoscopy, increase Axiom to twice daily, continue Carafate. ?We will do Levsin as needed. ?Possible component of constipation with addition of will go be add on fiber possible MiraLAX. ?Possible component of gastroparesis with [facility] given information. ?May need to stop wegovy if not improving/EGD/US negative ?Will schedule EGD to evaluate for possible H. pylori, esophagitis, gastritis, peptic ulcer disease, etc.. ?I discussed risks of EGD with patient today, including risk of sedation, bleeding or perforation.  ?Patient provides understanding and gave verbal consent to proceed. ? 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 ?Had done in [facility] ? ?History of ulcer disease ?-     H. pylori antibody, IgG; Future ?States had peptic ulcer disease after gastric sleeve, maternal aunt with history of gastric cancer. ?Patient is on PPIs we will check for antibodies since she is never had treatment per patient. ?We will continue on with endoscopic evaluation. ? ?Obesity, morbid, BMI 50 or higher ([place]) ?Patient will be scheduled at the hospital for their  procedure due to ASA IV criteria: BMI over 50 ? ? ?Patient Care Team: ?Chaney Malling, [place] as PCP - General (Physician Assistant) ?Berniece Salines, DO as PCP - Cardiology (Cardiology) ? ?HISTORY OF PRESENT ILLNESS: ?41 y.o. female with a past medical history of prediabetes, obesity, Hashimoto's thyroiditis and others listed below presents for evaluation of abdominal pain.  ? ?2017 s/p gastric sleeve with hiatal hernia repair and umbilical repair per patient at [facility] in [place].  ?States had EGD with ulcer in esophagus/stomach a month after gastric sleeve, treated with carafate and had another EGD that was normal.  ? ?Patient was seen in the ER 08/19/2021 for intermittent upper and right abdominal pain, GI cocktail resolve symptoms.  ?No anemia, no leukocytosis, normal lipase ? ?She was fine until 2 weeks ago she started to have umbilical AB discomfort and now it is RUQ /epigastric discomfort, some radiation to mid back.  ?She is s/p cholecystectomy.  ?She took laxative to help BM's and this did not help.  ? ?Has had some burping with acid into her throat.   ?Discharged with Carafate and PPI. ?She denies any nausea, vomiting, melena, hematochezia, urinary symptoms.   ?Currently on Wegovy for weight loss started x 6 weeks ago and trying to lose weight.  ?Denies alcohol or NSAID use. ?No family history of colon cancer, Maternal aunt with stomach cancer. ?Denies chest pain or shortness of breath.   ? ? ?Current Medications:  ? ? ?Current Outpatient Medications (Cardiovascular):  ?  furosemide (LASIX) 20 MG tablet, Take 1  tablet (20 mg total) by mouth once a week. ?  olmesartan (BENICAR) 20 MG tablet, Take 1 tablet (20 mg total) by mouth daily. ? ? ? ? ?Current Outpatient Medications (Other):  ?  BIOTIN PO, Take by mouth 3 (three) times a week. ?  gabapentin (NEURONTIN) 300 MG capsule, Take 300 mg by mouth daily. ?  hyoscyamine (OSCIMIN) 0.125 MG tablet, Take 1 tablet (0.125 mg total) by mouth every 6  (six) hours as needed for cramping. ?  potassium chloride (KLOR-CON) 10 MEQ tablet, Take 1 tablet by mouth weekly with furosemide dose. ?  Semaglutide-Weight Management 0.25 MG/0.5ML SOAJ, Inject 0.25 mg into the skin once a week. ?  Semaglutide-Weight Management 0.5 MG/0.5ML SOAJ, Inject 0.5 mg into the skin once a week. ?  Semaglutide-Weight Management 1 MG/0.5ML SOAJ, Inject 1 mg into the skin once a week. ?  Semaglutide-Weight Management 1.7 MG/0.75ML SOAJ, Inject 1.7 mg into the skin once a week. ?  Semaglutide-Weight Management 2.4 MG/0.75ML SOAJ, Inject 2.4 mg into the skin once a week. ?  sucralfate (CARAFATE) 1 g tablet, Take 1 tablet (1 g total) by mouth 4 (four) times daily -  with meals and at bedtime. ?  esomeprazole (NEXIUM) 40 MG capsule, Take 1 capsule (40 mg total) by mouth 2 (two) times daily before a meal. ? ?Medical History:  ?Past Medical History:  ?Diagnosis Date  ? GERD (gastroesophageal reflux disease)   ? Hashimoto's thyroiditis   ? Obesity   ? Prediabetes   ? ?Allergies:  ?Allergies  ?Allergen Reactions  ? Shellfish Allergy Anaphylaxis  ? Penicillins Rash  ?  ? ?Surgical History:  ?She  has a past surgical history that includes Cholecystectomy (2009); Laparoscopic gastric sleeve resection (2017); Tonsillectomy (2012); and Hiatal hernia repair (2017). ?Family History:  ?Her family history includes Kidney disease in her father; Stomach cancer in her maternal aunt. ?Social History:  ? reports that she has never smoked. She has never used smokeless tobacco. She reports that she does not drink alcohol and does not use drugs. ? ?REVIEW OF SYSTEMS  : All other systems reviewed and negative except where noted in the History of Present Illness. ? ? ?PHYSICAL EXAM: ?BP 108/78   Pulse 86   Ht '4\' 11"'$  (1.499 m)   Wt 254 lb 3.2 oz (115.3 kg)   SpO2 98%   BMI 51.34 kg/m?  ?General:   Pleasant, well developed female in no acute distress ?Head:   Normocephalic and atraumatic. ?Eyes:  scleral  icterus,conjunctive pink  ?Heart:   regular rate and rhythm ?Pulm:  Clear anteriorly; no wheezing ?Abdomen:   Soft, Obese AB, Sluggish bowel sounds. mild tenderness in the RUQ. Without guarding and Without rebound, No organomegaly appreciated. ?Rectal: Not evaluated ?Extremities:  With edema. ?Msk: Symmetrical without gross deformities. Peripheral pulses intact.  ?Neurologic:  Alert and  oriented x4;  No focal deficits.  ?Skin:   Dry and intact without significant lesions or rashes. ?Psychiatric:  Cooperative. Normal mood and affect. ? ? ? ?Vladimir Crofts, PA-C ?12:14 PM ? ? ?

## 2021-08-20 NOTE — Patient Instructions (Addendum)
?  TIPS FOR SUCCESS ?Write down the reasons why you want to lose weight and post it in a place where you'll see it often. ?Start small and work your way up. Keep in mind that it takes time to achieve goals, and small steps add up. ?Any additional movements help to burn calories. Taking the stairs rather than the elevator and parking at the far end of your parking lot are easy ways to start. Brisk walking for at least 30 minutes 4 or more days of the week is an excellent goal to work toward ? ?UNDERSTANDING WHAT IT MEANS TO FEEL FULL ?Did you know that it can take 15 minutes or more for your brain to receive the message that you've eaten? That means that, if you eat less food, but consume it slower, you may still feel satisfied. ?Eating a lot of fruits and vegetables can also help you feel fuller. ?Eat off of smaller plates so that moderate portions don't seem too small ? ?TITRATION PLAN ?Will plan to follow the titration plan as below, pending patient is tolerating each dose before increasing to the next. Can slow titration if needed for tolerability.  ?  ?-Weeks 5-8: Inject 0.5 mg SQ once weekly  ?-Weeks 9-12 Inject 1 mg SQ once weekly  ?-Weeks 13-16: Inject 1.7 SQ once weekly  ? ?Follow up in 3 months - August 10 at 10 am ? ?If you have any questions or concerns, please reach out to [place].  Levette Paulick/Chris at [phone]. ? ?[place]  ? ? ?

## 2021-08-20 NOTE — Assessment & Plan Note (Signed)
Patient with essential hypertension, much improved on olmesartan 20 mg daily.  Metabolic panel drawn yesterday shows electrolytes and SCr stable.  Will have patient continue with daily olmesartan 20 mg.  She should check home BP readings a few times each week and report any concerns.  ?

## 2021-08-21 ENCOUNTER — Encounter: Payer: Self-pay | Admitting: Physician Assistant

## 2021-08-21 ENCOUNTER — Telehealth: Payer: Self-pay | Admitting: Physician Assistant

## 2021-08-21 ENCOUNTER — Other Ambulatory Visit (INDEPENDENT_AMBULATORY_CARE_PROVIDER_SITE_OTHER): Payer: BC Managed Care – PPO

## 2021-08-21 ENCOUNTER — Other Ambulatory Visit: Payer: Self-pay

## 2021-08-21 ENCOUNTER — Ambulatory Visit (INDEPENDENT_AMBULATORY_CARE_PROVIDER_SITE_OTHER): Payer: BC Managed Care – PPO | Admitting: Physician Assistant

## 2021-08-21 VITALS — BP 108/78 | HR 86 | Ht 59.0 in | Wt 254.2 lb

## 2021-08-21 DIAGNOSIS — Z87898 Personal history of other specified conditions: Secondary | ICD-10-CM

## 2021-08-21 DIAGNOSIS — R1010 Upper abdominal pain, unspecified: Secondary | ICD-10-CM

## 2021-08-21 DIAGNOSIS — Z903 Acquired absence of stomach [part of]: Secondary | ICD-10-CM | POA: Diagnosis not present

## 2021-08-21 LAB — COMPREHENSIVE METABOLIC PANEL
ALT: 12 U/L (ref 0–35)
AST: 13 U/L (ref 0–37)
Albumin: 4.3 g/dL (ref 3.5–5.2)
Alkaline Phosphatase: 73 U/L (ref 39–117)
BUN: 14 mg/dL (ref 6–23)
CO2: 26 mEq/L (ref 19–32)
Calcium: 9.5 mg/dL (ref 8.4–10.5)
Chloride: 102 mEq/L (ref 96–112)
Creatinine, Ser: 0.86 mg/dL (ref 0.40–1.20)
GFR: 83.35 mL/min (ref >60.00)
Glucose, Bld: 86 mg/dL (ref 70–99)
Potassium: 4.3 mEq/L (ref 3.5–5.1)
Sodium: 137 mEq/L (ref 135–145)
Total Bilirubin: 0.5 mg/dL (ref 0.2–1.2)
Total Protein: 7.7 g/dL (ref 6.0–8.3)

## 2021-08-21 LAB — H. PYLORI ANTIBODY, IGG: H Pylori IgG: NEGATIVE

## 2021-08-21 MED ORDER — ESOMEPRAZOLE MAGNESIUM 40 MG PO CPDR
40.0000 mg | DELAYED_RELEASE_CAPSULE | Freq: Two times a day (BID) | ORAL | 3 refills | Status: DC
  Filled 2021-11-09: qty 60, 30d supply, fill #0
  Filled 2021-12-30: qty 60, 30d supply, fill #1
  Filled 2022-01-21 – 2022-01-22 (×2): qty 60, 30d supply, fill #2
  Filled 2022-03-29: qty 60, 30d supply, fill #3

## 2021-08-21 MED ORDER — OSCIMIN 0.125 MG PO TABS: 0.1250 mg | ORAL_TABLET | Freq: Four times a day (QID) | ORAL | 0 refills | Status: AC | PRN

## 2021-08-21 NOTE — Progress Notes (Signed)
I agree with the assessment and plan as outlined by Ms. Silverio Lay.  ? ?Ammie, let's see if she will come in for EGD on 6/19 at [facility]. I have been told that I can add on procedures in the morning when I am on inpatient. Thanks. ?

## 2021-08-21 NOTE — Telephone Encounter (Signed)
Patient returned your phone call.

## 2021-08-21 NOTE — Patient Instructions (Addendum)
Increase nexium to twice a day for now ?Can continue sulcrafate as needed ?Can give levsin AS needed for AB pain.  ? ?Avoid spicy and acidic foods ?Avoid fatty foods ?Limit your intake of coffee, tea, alcohol, and carbonated drinks ?Work to maintain a healthy weight ?Keep the head of the bed elevated at least 3 inches with blocks or a wedge pillow if you are having any nighttime symptoms ?Stay upright for 2 hours after eating ?Avoid meals and snacks three to four hours before bedtime ? ?Can do fiber supplement and miralax as needed for constipation.  ? ?If symptoms continue or EGD/imaging negative may need to talk about stopping wegovy.  ?Gastroparesis ?Please do small frequent meals like 4-6 meals a day.  ?Eat and drink liquids at separate times.  ?Avoid high fiber foods, cook your vegetables, avoid high fat food.  ?Suggest spreading protein throughout the day (greek yogurt, glucerna, soft meat, milk, eggs) ?Choose soft foods that you can mash with a fork ?When you are more symptomatic, change to pureed foods foods and liquids.  ?Consider reading "Living well with Gastroparesis" by Lambert Keto ?Gastroparesis is a condition in which food takes longer than normal to empty from the stomach. This condition is also known as delayed gastric emptying. It is usually a long-term (chronic) condition. ?There is no cure, but there are treatments and things that you can do at home to help relieve symptoms. Treating the underlying condition that causes gastroparesis can also help relieve symptoms ? ? ?Your provider has requested that you go to the basement level for lab work before leaving today. Press "B" on the elevator. The lab is located at the first door on the left as you exit the elevator. ? ?Due to recent changes in healthcare laws, you may see the results of your imaging and laboratory studies on MyChart before your provider has had a chance to review them.  We understand that in some cases there may be results  that are confusing or concerning to you. Not all laboratory results come back in the same time frame and the provider may be waiting for multiple results in order to interpret others.  Please give [place] 48 hours in order for your provider to thoroughly review all the results before contacting the office for clarification of your results.  ? ?You will be contacted by [facility] in the next 2 days to arrange an Ultrasound.  The number on your caller ID will be [phone], please answer when they call.  If you have not heard from them in 2 days please call [phone] to schedule.   ? ? ?We will contact you about scheduling an EGD at the hospital.  ? ?I appreciate the opportunity to care for you. ?Vicie Mutters, PA-C ?

## 2021-08-21 NOTE — Progress Notes (Signed)
Pt agreeable to being scheduled for procedure 09/28/21. Scheduled at [facility] 09/28/21 @ 730am, arrival time 6am. Amb referral placed for auth purposes. Per pt request, prep instructions sent via My Chart. ?

## 2021-08-24 NOTE — Telephone Encounter (Signed)
See result note. Patient informed via mychart. ?

## 2021-08-25 ENCOUNTER — Ambulatory Visit (HOSPITAL_COMMUNITY)
Admission: RE | Admit: 2021-08-25 | Discharge: 2021-08-25 | Disposition: A | Discharge status: Home / Self Care | Payer: BC Managed Care – PPO | Source: Ambulatory Visit | Attending: Physician Assistant | Admitting: Physician Assistant

## 2021-08-25 DIAGNOSIS — R1010 Upper abdominal pain, unspecified: Secondary | ICD-10-CM | POA: Insufficient documentation

## 2021-09-11 ENCOUNTER — Other Ambulatory Visit: Payer: Self-pay | Admitting: Cardiology

## 2021-09-14 ENCOUNTER — Encounter: Payer: Self-pay | Admitting: Pharmacist Clinician (PhC)/ Clinical Pharmacy Specialist

## 2021-09-14 ENCOUNTER — Other Ambulatory Visit (HOSPITAL_BASED_OUTPATIENT_CLINIC_OR_DEPARTMENT_OTHER): Payer: Self-pay | Admitting: Cardiology

## 2021-09-16 ENCOUNTER — Telehealth: Payer: Self-pay | Admitting: Cardiology

## 2021-09-16 ENCOUNTER — Encounter (HOSPITAL_COMMUNITY): Payer: Self-pay | Admitting: Internal Medicine

## 2021-09-16 ENCOUNTER — Other Ambulatory Visit (HOSPITAL_BASED_OUTPATIENT_CLINIC_OR_DEPARTMENT_OTHER): Payer: Self-pay

## 2021-09-16 MED ORDER — WEGOVY 1 MG/0.5ML ~~LOC~~ SOAJ
1.0000 mg | SUBCUTANEOUS | 1 refills | Status: DC
  Filled 2021-09-16: qty 2, 28d supply, fill #0

## 2021-09-16 NOTE — Progress Notes (Signed)
Attempted to obtain medical history via telephone, unable to reach at this time. HIPAA compliant voicemail message left requesting return call to pre surgical testing department. 

## 2021-09-16 NOTE — Telephone Encounter (Signed)
Pt c/o medication issue:  1. Name of Medication:   Semaglutide-Weight Management 0.25 MG/0.5ML SOAJ    2. How are you currently taking this medication (dosage and times per day)?   3. Are you having a reaction (difficulty breathing--STAT)? No  4. What is your medication issue? Pt states that her pharmacy has medication on back order and doesn't know when it will be in. Pt says that she has checked with other pharmacy's and they do not have it as well. Pt said that means she will be without medication for a week or longer. Please advise

## 2021-09-17 ENCOUNTER — Other Ambulatory Visit (HOSPITAL_BASED_OUTPATIENT_CLINIC_OR_DEPARTMENT_OTHER): Payer: Self-pay

## 2021-09-17 NOTE — Telephone Encounter (Signed)
Prescription called to [place] at [facility].  Patient aware

## 2021-09-28 ENCOUNTER — Other Ambulatory Visit: Payer: Self-pay

## 2021-09-28 ENCOUNTER — Ambulatory Visit (HOSPITAL_COMMUNITY): Payer: BC Managed Care – PPO | Admitting: Certified Registered Nurse Anesthetist

## 2021-09-28 ENCOUNTER — Encounter (HOSPITAL_COMMUNITY): Payer: Self-pay | Admitting: Internal Medicine

## 2021-09-28 ENCOUNTER — Encounter: Payer: Self-pay | Admitting: Internal Medicine

## 2021-09-28 ENCOUNTER — Ambulatory Visit (HOSPITAL_COMMUNITY)
Admission: RE | Admit: 2021-09-28 | Discharge: 2021-09-28 | Disposition: A | Discharge status: Home / Self Care | Payer: BC Managed Care – PPO | Source: Ambulatory Visit | Attending: Internal Medicine | Admitting: Internal Medicine

## 2021-09-28 ENCOUNTER — Encounter (HOSPITAL_COMMUNITY)
Admission: RE | Disposition: A | Discharge status: Home / Self Care | Payer: Self-pay | Source: Ambulatory Visit | Attending: Internal Medicine

## 2021-09-28 DIAGNOSIS — K3189 Other diseases of stomach and duodenum: Secondary | ICD-10-CM | POA: Diagnosis not present

## 2021-09-28 DIAGNOSIS — K2970 Gastritis, unspecified, without bleeding: Secondary | ICD-10-CM

## 2021-09-28 DIAGNOSIS — K317 Polyp of stomach and duodenum: Secondary | ICD-10-CM | POA: Diagnosis not present

## 2021-09-28 DIAGNOSIS — R1013 Epigastric pain: Secondary | ICD-10-CM | POA: Insufficient documentation

## 2021-09-28 DIAGNOSIS — Z79899 Other long term (current) drug therapy: Secondary | ICD-10-CM | POA: Insufficient documentation

## 2021-09-28 DIAGNOSIS — Z6841 Body Mass Index (BMI) 40.0 and over, adult: Secondary | ICD-10-CM | POA: Diagnosis not present

## 2021-09-28 DIAGNOSIS — K2100 Gastro-esophageal reflux disease with esophagitis, without bleeding: Secondary | ICD-10-CM | POA: Diagnosis not present

## 2021-09-28 DIAGNOSIS — Z903 Acquired absence of stomach [part of]: Secondary | ICD-10-CM

## 2021-09-28 DIAGNOSIS — Z87898 Personal history of other specified conditions: Secondary | ICD-10-CM

## 2021-09-28 DIAGNOSIS — K449 Diaphragmatic hernia without obstruction or gangrene: Secondary | ICD-10-CM | POA: Insufficient documentation

## 2021-09-28 DIAGNOSIS — K222 Esophageal obstruction: Secondary | ICD-10-CM | POA: Diagnosis not present

## 2021-09-28 DIAGNOSIS — K2950 Unspecified chronic gastritis without bleeding: Secondary | ICD-10-CM | POA: Diagnosis not present

## 2021-09-28 DIAGNOSIS — Z9884 Bariatric surgery status: Secondary | ICD-10-CM | POA: Diagnosis not present

## 2021-09-28 DIAGNOSIS — I10 Essential (primary) hypertension: Secondary | ICD-10-CM | POA: Diagnosis not present

## 2021-09-28 DIAGNOSIS — R1010 Upper abdominal pain, unspecified: Secondary | ICD-10-CM

## 2021-09-28 HISTORY — PX: BIOPSY: SHX5522

## 2021-09-28 HISTORY — PX: ESOPHAGOGASTRODUODENOSCOPY (EGD) WITH PROPOFOL: SHX5813

## 2021-09-28 HISTORY — PX: POLYPECTOMY: SHX5525

## 2021-09-28 SURGERY — ESOPHAGOGASTRODUODENOSCOPY (EGD) WITH PROPOFOL
Anesthesia: Monitor Anesthesia Care

## 2021-09-28 MED ORDER — PROPOFOL 500 MG/50ML IV EMUL
INTRAVENOUS | Status: DC | PRN
  Administered 2021-09-28: 100 ug/kg/min via INTRAVENOUS

## 2021-09-28 MED ORDER — PHENYLEPHRINE 80 MCG/ML (10ML) SYRINGE FOR IV PUSH (FOR BLOOD PRESSURE SUPPORT)
PREFILLED_SYRINGE | INTRAVENOUS | Status: DC | PRN
  Administered 2021-09-28: 160 ug via INTRAVENOUS
  Administered 2021-09-28 (×2): 80 ug via INTRAVENOUS

## 2021-09-28 MED ORDER — SODIUM CHLORIDE 0.9 % IV SOLN: INTRAVENOUS | Status: DC

## 2021-09-28 MED ORDER — FAMOTIDINE 20 MG PO TABS: 20.0000 mg | ORAL_TABLET | Freq: Two times a day (BID) | ORAL | 1 refills | Status: DC

## 2021-09-28 MED ORDER — PROPOFOL 10 MG/ML IV BOLUS
INTRAVENOUS | Status: DC | PRN
  Administered 2021-09-28: 20 mg via INTRAVENOUS
  Administered 2021-09-28: 40 mg via INTRAVENOUS
  Administered 2021-09-28: 30 mg via INTRAVENOUS

## 2021-09-28 MED ORDER — LACTATED RINGERS IV SOLN: INTRAVENOUS | Status: DC | PRN

## 2021-09-28 MED ORDER — LIDOCAINE 2% (20 MG/ML) 5 ML SYRINGE
INTRAMUSCULAR | Status: DC | PRN
  Administered 2021-09-28: 40 mg via INTRAVENOUS

## 2021-09-28 SURGICAL SUPPLY — 15 items

## 2021-09-28 NOTE — Op Note (Addendum)
[facility] Patient Name: Kristina Khan Procedure Date : 09/28/2021 MRN: [id number] Attending MD: Georgian Co ,  Date of Birth: 13-Jul-1980 CSN: [id number] Age: 41 Admit Type: Outpatient Procedure:                Upper GI endoscopy Indications:              Epigastric abdominal pain Providers:                Adline Mango" Lady Deutscher, RN, Luan Moore, Technician, Trixie Deis, CRNA Referring MD:             Vicie Mutters Medicines:                Monitored Anesthesia Care Complications:            No immediate complications. Estimated Blood Loss:     Estimated blood loss was minimal. Procedure:                Pre-Anesthesia Assessment:                           - Prior to the procedure, a History and Physical                            was performed, and patient medications and                            allergies were reviewed. The patient's tolerance of                            previous anesthesia was also reviewed. The risks                            and benefits of the procedure and the sedation                            options and risks were discussed with the patient.                            All questions were answered, and informed consent                            was obtained. Prior Anticoagulants: The patient has                            taken no previous anticoagulant or antiplatelet                            agents. ASA Grade Assessment: III - A patient with                            severe systemic disease. After reviewing the risks  and benefits, the patient was deemed in                            satisfactory condition to undergo the procedure.                           After obtaining informed consent, the endoscope was                            passed under direct vision. Throughout the                            procedure, the patient's blood pressure, pulse,  and                            oxygen saturations were monitored continuously. The                            [id number]-[id number] ([id number]) Olympus endoscope was introduced                            through the mouth, and advanced to the second part                            of duodenum. The upper GI endoscopy was                            accomplished without difficulty. The patient                            tolerated the procedure well. Scope In: Scope Out: Findings:      LA Grade A (one or more mucosal breaks less than 5 mm, not extending       between tops of 2 mucosal folds) esophagitis with no bleeding was found       in the distal esophagus.      A non-obstructing Schatzki ring was found at the gastroesophageal       junction.      A large hiatal hernia was present.      Evidence of a sleeve gastrectomy was found in the gastric body. This was       characterized by healthy appearing mucosa.      Localized inflammation characterized by congestion (edema) and erythema       was found in the gastric antrum. Biopsies were taken with a cold forceps       for histology.      Two 4 to 10 mm sessile polyps with no bleeding and no stigmata of recent       bleeding were found in the gastric antrum. These polyps were removed       with a cold snare. Resection and retrieval were complete.      The examined duodenum was normal. Impression:               - LA Grade A esophagitis with no bleeding.                           -  Non-obstructing Schatzki ring.                           - Large hiatal hernia.                           - A sleeve gastrectomy was found, characterized by                            healthy appearing mucosa.                           - Gastritis. Biopsied.                           - Two gastric polyps. Resected and retrieved.                           - Normal examined duodenum. Recommendation:           - Discharge patient to home (with escort).                           -  Await pathology results.                           - Continue Nexium 40 mg BID. Will start on Pepcid                            20 mg BID as a trial to see if this helps with                            symptoms.                           - The findings and recommendations were discussed                            with the patient.                           - Return to GI clinic in 4 weeks. Procedure Code(s):        --- Professional ---                           [phone], Esophagogastroduodenoscopy, flexible,                            transoral; with removal of tumor(s), polyp(s), or                            other lesion(s) by snare technique                           43239, 59, Esophagogastroduodenoscopy, flexible,  transoral; with biopsy, single or multiple Diagnosis Code(s):        --- Professional ---                           K44.9, Diaphragmatic hernia without obstruction or                            gangrene                           Z98.84, Bariatric surgery status                           K29.70, Gastritis, unspecified, without bleeding                           K31.7, Polyp of stomach and duodenum                           R10.13, Epigastric pain CPT copyright 2019 American Medical Association. All rights reserved. The codes documented in this report are preliminary and upon coder review may  be revised to meet current compliance requirements. [name] "[name],  09/28/2021 8:55:05 AM Number of Addenda: 0

## 2021-09-28 NOTE — Transfer of Care (Signed)
Immediate Anesthesia Transfer of Care Note  Patient: Kristina Khan  Procedure(s) Performed: ESOPHAGOGASTRODUODENOSCOPY (EGD) WITH PROPOFOL BIOPSY POLYPECTOMY  Patient Location: PACU  Anesthesia Type:MAC  Level of Consciousness: awake, alert  and oriented  Airway & Oxygen Therapy: Patient Spontanous Breathing and Patient connected to nasal cannula oxygen  Post-op Assessment: Report given to RN and Post -op Vital signs reviewed and stable  Post vital signs: Reviewed and stable  Last Vitals:  Vitals Value Taken Time  BP 131/62 09/28/21 0847  Temp    Pulse 107 09/28/21 0848  Resp 25 09/28/21 0848  SpO2 100 % 09/28/21 0848  Vitals shown include unvalidated device data.  Last Pain:  Vitals:   09/28/21 0653  TempSrc: Temporal  PainSc: 0-No pain         Complications: No notable events documented.

## 2021-09-28 NOTE — Anesthesia Preprocedure Evaluation (Signed)
Anesthesia Evaluation  Patient identified by MRN, date of birth, ID band Patient awake    Reviewed: Allergy & Precautions, NPO status , Patient's Chart, lab work & pertinent test results  Airway Mallampati: II       Dental no notable dental hx.    Pulmonary neg pulmonary ROS,    Pulmonary exam normal        Cardiovascular hypertension, Pt. on medications Normal cardiovascular exam     Neuro/Psych negative neurological ROS  negative psych ROS   GI/Hepatic GERD  Medicated,  Endo/Other  Morbid obesity  Renal/GU   negative genitourinary   Musculoskeletal negative musculoskeletal ROS (+)   Abdominal (+) + obese,   Peds  Hematology   Anesthesia Other Findings   Reproductive/Obstetrics                             Anesthesia Physical Anesthesia Plan  ASA: 3  Anesthesia Plan: MAC   Post-op Pain Management: Minimal or no pain anticipated   Induction:   PONV Risk Score and Plan: Propofol infusion, TIVA and Treatment may vary due to age or medical condition  Airway Management Planned: Natural Airway and Mask  Additional Equipment: None  Intra-op Plan:   Post-operative Plan:   Informed Consent: I have reviewed the patients History and Physical, chart, labs and discussed the procedure including the risks, benefits and alternatives for the proposed anesthesia with the patient or authorized representative who has indicated his/her understanding and acceptance.     Dental advisory given  Plan Discussed with: CRNA  Anesthesia Plan Comments:         Anesthesia Quick Evaluation

## 2021-09-28 NOTE — H&P (Signed)
GASTROENTEROLOGY PROCEDURE H&P NOTE   Primary Care Physician: Sherin Quarry, PA-C    Reason for Procedure:   Epigastric ab pain  Plan:    EGD  Patient is appropriate for endoscopic procedure(s) in the hospital setting.  The nature of the procedure, as well as the risks, benefits, and alternatives were carefully and thoroughly reviewed with the patient. Ample time for discussion and questions allowed. The patient understood, was satisfied, and agreed to proceed.     HPI: Kristina Khan is a 41 y.o. female who presents for EGD for evaluation of epigastric ab pain .  Patient was most recently seen in the Gastroenterology Clinic on 08/21/21.  No interval change in medical history since that appointment. Please refer to that note for full details regarding GI history and clinical presentation.   Past Medical History:  Diagnosis Date   GERD (gastroesophageal reflux disease)    Hashimoto's thyroiditis    Obesity    Prediabetes     Past Surgical History:  Procedure Laterality Date   CHOLECYSTECTOMY  2009   HIATAL HERNIA REPAIR  2017   LAPAROSCOPIC GASTRIC SLEEVE RESECTION  2017   TONSILLECTOMY  2012    Prior to Admission medications   Medication Sig Start Date End Date Taking? Authorizing Provider  Calcium Citrate-Vitamin D (CALCIUM CITRATE +D PO) Take 1 each by mouth 2 (two) times a week.   Yes [provider]  esomeprazole (NEXIUM) 40 MG capsule Take 1 capsule (40 mg total) by mouth 2 (two) times daily before a meal. 08/21/21  Yes Vicie Mutters R, PA-C  furosemide (LASIX) 20 MG tablet Take 1 tablet (20 mg total) by mouth once a week. Patient taking differently: Take 20 mg by mouth daily as needed for fluid. 07/09/21 10/07/21 Yes Tobb, Kardie, DO  Multiple Vitamins-Minerals (BARIATRIC FUSION PO) Take 1 tablet by mouth in the morning and at bedtime.   Yes [provider]  olmesartan (BENICAR) 20 MG tablet TAKE 1 TABLET BY MOUTH EVERY DAY 09/11/21  Yes  Tobb, Kardie, DO  potassium chloride (KLOR-CON) 10 MEQ tablet Take 1 tablet by mouth weekly with furosemide dose. Patient taking differently: Take 10 mEq by mouth daily as needed (Take with Lasix). Take 1 tablet by mouth weekly with furosemide dose. 07/09/21  Yes Tobb, Kardie, DO  Semaglutide-Weight Management (WEGOVY) 1 MG/0.5ML SOAJ Inject 1 mg into the skin every 7 (seven) days. 09/16/21  Yes Tobb, Kardie, DO  Vitamin D, Ergocalciferol, (DRISDOL) 1.25 MG (50000 UNIT) CAPS capsule Take 50,000 Units by mouth every Tuesday.   Yes [provider]  albuterol (VENTOLIN HFA) 108 (90 Base) MCG/ACT inhaler Inhale into the lungs every 6 (six) hours as needed for wheezing or shortness of breath.    [provider]  BIOTIN PO Take 1 tablet by mouth 3 (three) times a week.    [provider]  gabapentin (NEURONTIN) 300 MG capsule Take 300 mg by mouth at bedtime as needed (pain). 06/21/19   [provider]  hyoscyamine (OSCIMIN) 0.125 MG tablet Take 1 tablet (0.125 mg total) by mouth every 6 (six) hours as needed for cramping. 08/21/21   Vladimir Crofts, PA-C  Semaglutide-Weight Management 0.25 MG/0.5ML SOAJ Inject 0.25 mg into the skin once a week. Patient not taking: Reported on 09/21/2021 07/15/21   Berniece Salines, DO  Semaglutide-Weight Management 0.5 MG/0.5ML SOAJ Inject 0.5 mg into the skin once a week. Patient not taking: Reported on 09/21/2021 07/15/21   Berniece Salines, DO  Semaglutide-Weight Management 1 MG/0.5ML SOAJ Inject 1 mg into the skin once a week. Patient not taking: Reported on 09/21/2021 07/15/21   Berniece Salines, DO  Semaglutide-Weight Management 1.7 MG/0.75ML SOAJ Inject 1.7 mg into the skin once a week. Patient not taking: Reported on 09/21/2021 07/15/21   Berniece Salines, DO  Semaglutide-Weight Management 2.4 MG/0.75ML SOAJ Inject 2.4 mg into the skin once a week. Patient not taking: Reported on 09/21/2021 07/15/21   Tobb, Kardie, DO  sucralfate (CARAFATE) 1 g tablet Take 1  tablet (1 g total) by mouth 4 (four) times daily -  with meals and at bedtime. Patient not taking: Reported on 09/21/2021 08/19/21   Raspet, Derry Skill, PA-C    Current Facility-Administered Medications  Medication Dose Route Frequency Provider Last Rate Last Admin   0.9 %  sodium chloride infusion   Intravenous Continuous Sharyn Creamer, MD        Allergies as of 08/21/2021 - Review Complete 08/21/2021  Allergen Reaction Noted   Shellfish allergy Anaphylaxis 06/21/2019   Penicillins Rash 06/21/2019    Family History  Problem Relation Age of Onset   Kidney disease Father    Stomach cancer Maternal Aunt     Social History   Socioeconomic History   Marital status: Single    Spouse name: Not on file   Number of children: Not on file   Years of education: Not on file   Highest education level: Not on file  Occupational History   Not on file  Tobacco Use   Smoking status: Never   Smokeless tobacco: Never  Substance and Sexual Activity   Alcohol use: Never   Drug use: Never   Sexual activity: Not on file  Other Topics Concern   Not on file  Social History Narrative   Not on file   Social Determinants of Health   Financial Resource Strain: Not on file  Food Insecurity: Not on file  Transportation Needs: Not on file  Physical Activity: Not on file  Stress: Not on file  Social Connections: Not on file  Intimate Partner Violence: Not on file    Physical Exam: Vital signs in last 24 hours: BP 133/72   Pulse 87   Temp 97.9 F (36.6 C) (Temporal)   Resp 15   Ht '4\' 11"'$  (1.499 m)   Wt 112.5 kg   SpO2 100%   BMI 50.09 kg/m  GEN: NAD EYE: Sclerae anicteric ENT: MMM CV: Non-tachycardic Pulm: No increased WOB GI: Soft NEURO:  Alert & Oriented   [name], MD [facility]   09/28/2021 7:44 AM

## 2021-09-28 NOTE — Anesthesia Postprocedure Evaluation (Signed)
Anesthesia Post Note  Patient: Kristina Khan  Procedure(s) Performed: ESOPHAGOGASTRODUODENOSCOPY (EGD) WITH PROPOFOL BIOPSY POLYPECTOMY     Patient location during evaluation: PACU Anesthesia Type: MAC Level of consciousness: awake Pain management: pain level controlled Vital Signs Assessment: post-procedure vital signs reviewed and stable Respiratory status: spontaneous breathing Cardiovascular status: stable Postop Assessment: no apparent nausea or vomiting Anesthetic complications: no   No notable events documented.  Last Vitals:  Vitals:   09/28/21 0847 09/28/21 0903  BP: 131/62 (!) 103/58  Pulse: (!) 107 86  Resp: 20 16  Temp: 37 C   SpO2: 100% 99%    Last Pain:  Vitals:   09/28/21 0847  TempSrc:   PainSc: 0-No pain                 Huston Foley

## 2021-09-29 LAB — SURGICAL PATHOLOGY

## 2021-10-01 ENCOUNTER — Encounter: Payer: Self-pay | Admitting: Pharmacist Clinician (PhC)/ Clinical Pharmacy Specialist

## 2021-10-01 NOTE — Telephone Encounter (Signed)
Called the patient. She states that she is having similar epigastric ab pain to what she felt before she came to the hospital. She is taking the Nexium and Pepcid but this is not resolving her pain. I reassured her that the biopsies and polyp resections would not cause pain. The hernia can cause acid reflux but would not directly cause pain either. Patient states that lifting appears to cause worsened ab pain, which could suggest a MSK component to her pain. She is still taking her Wegovy medication so I told her that she could reach out to her PCP to see if she needs to stop this medication due to her symptoms. I told her we would get her into a follow up GI appt.  Beth, let's get her into a follow up GI appt at our next available time either with [facility] or me. Thanks.

## 2021-10-19 ENCOUNTER — Other Ambulatory Visit (HOSPITAL_BASED_OUTPATIENT_CLINIC_OR_DEPARTMENT_OTHER): Payer: Self-pay

## 2021-10-20 ENCOUNTER — Other Ambulatory Visit (HOSPITAL_BASED_OUTPATIENT_CLINIC_OR_DEPARTMENT_OTHER): Payer: Self-pay

## 2021-10-20 MED ORDER — FUROSEMIDE 20 MG PO TABS: 20.0000 mg | ORAL_TABLET | ORAL | 3 refills | Status: DC

## 2021-10-27 NOTE — Progress Notes (Signed)
10/29/2021 Kristina Khan [id number] 07-01-1980  Referring provider: Sherin Quarry, PA-C Primary GI doctor: Dr. Lorenso Courier  ASSESSMENT AND PLAN:   RUQ AB pain CT AB and pelvis unremarkable Likely muscular, worse with movement, + carnett, no rash. EGD with large hiatal hernia, gastriits Continue on PPI BID and Pecid BID Continue weight loss  Large hiatal hernia with gastritis, negative H pylori Complaining of refractory GERD symptoms despite PPI therapy BID 09/2021 EGD with large hiatal hernia, gastriits Continue with antireflux measures and lifestyle modifications, continue on PPI emphasizing timing prior to meals Continue on PPI BID and Pecid BID Continue weight loss but ? If need trial off wegovy Patient is interested in hearing about surgical options for GERD, unknown if she would want to pursue that at this time.  She is not interested in another bariatric surgery Will refer to [facility]  antireflux surgery  H/O gastric sleeve  Had done in [facility]  Obesity, morbid, BMI 50 or higher ([place]) Body mass index is 50.05 kg/m.  -Patient has been advised to make an attempt to improve diet and exercise patterns to aid in weight loss. -Recommended diet heavy in fruits and veggies and low in animal meats, cheeses, and dairy products, appropriate calorie intake    Patient Care Team: Corinna Gab as PCP - General (Physician Assistant) Berniece Salines, DO as PCP - Cardiology (Cardiology)  HISTORY OF PRESENT ILLNESS: 41 y.o. female with a past medical history of prediabetes, obesity, Hashimoto's thyroiditis and others listed below presents for evaluation of abdominal pain.   2017 s/p gastric sleeve with hiatal hernia repair and umbilical repair per patient at [facility] in [place].  States had EGD with ulcer in esophagus/stomach a month after gastric sleeve, treated with carafate and had another EGD that was normal.   EGD 09/28/2021  Path: non H pylori gastritis, polyp with hyperplasia   Called 10/01/2021 with continuing AB pain, worse with movement.  She is s/p cholecystectomy.  She has RUQ AB pain.  She was at [facility], picked up milk/cabbage to put in cart and had very sharp pain. 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  Worse with arms crossed on chest.  Some pain rotating to the left. No rash.  She has continuing GERD with nexium twice a day and H2 twice a day.  She does have nausea if she is full, no vomiting.  Currently on Wegovy for weight loss started x 8 weeks ago and trying to lose weight, now on 1.7.  She denies any melena, hematochezia, urinary symptoms.   Denies alcohol or NSAID use. No family history of colon cancer, Maternal aunt with stomach cancer. Denies chest pain or shortness of breath.     Current Medications:    Current Outpatient Medications (Cardiovascular):    furosemide (LASIX) 20 MG tablet, Take 1 tablet (20 mg total) by mouth once a week.   olmesartan (BENICAR) 20 MG tablet, TAKE 1 TABLET BY MOUTH EVERY DAY  Current Outpatient Medications (Respiratory):    albuterol (VENTOLIN HFA) 108 (90 Base) MCG/ACT inhaler, Inhale into the lungs every 6 (six) hours as needed for wheezing or shortness of breath.    Current Outpatient Medications (Other):    BIOTIN PO, Take 1 tablet by mouth 3 (three) times a week.   Calcium Citrate-Vitamin D (CALCIUM CITRATE +D PO), Take 1 each by mouth 2 (two) times a week.   esomeprazole (NEXIUM) 40 MG capsule, Take 1  capsule (40 mg total) by mouth 2 (two) times daily before a meal.   famotidine (PEPCID) 20 MG tablet, Take 1 tablet (20 mg total) by mouth 2 (two) times daily.   gabapentin (NEURONTIN) 300 MG capsule, Take 300 mg by mouth at bedtime as needed (pain).   hyoscyamine (OSCIMIN) 0.125 MG tablet, Take 1 tablet (0.125 mg total) by mouth every 6 (six) hours as needed for cramping.   Multiple Vitamins-Minerals (BARIATRIC FUSION PO),  Take 1 tablet by mouth in the morning and at bedtime.   potassium chloride (KLOR-CON) 10 MEQ tablet, Take 1 tablet by mouth weekly with furosemide dose. (Patient taking differently: Take 10 mEq by mouth daily as needed (Take with Lasix). Take 1 tablet by mouth weekly with furosemide dose.)   Semaglutide-Weight Management 1.7 MG/0.75ML SOAJ, Inject 1.7 mg into the skin once a week.   Vitamin D, Ergocalciferol, (DRISDOL) 1.25 MG (50000 UNIT) CAPS capsule, Take 50,000 Units by mouth every Tuesday.   Semaglutide-Weight Management 1 MG/0.5ML SOAJ, Inject 1 mg into the skin once a week. (Patient not taking: Reported on 09/21/2021)   Semaglutide-Weight Management 2.4 MG/0.75ML SOAJ, Inject 2.4 mg into the skin once a week. (Patient not taking: Reported on 09/21/2021)  Medical History:  Past Medical History:  Diagnosis Date   GERD (gastroesophageal reflux disease)    Hashimoto's thyroiditis    Obesity    Prediabetes    Allergies:  Allergies  Allergen Reactions   Shellfish Allergy Anaphylaxis   Penicillins Rash     Surgical History:  She  has a past surgical history that includes Cholecystectomy (2009); Laparoscopic gastric sleeve resection (2017); Tonsillectomy (2012); Hiatal hernia repair (2017); Esophagogastroduodenoscopy (egd) with propofol (N/A, 09/28/2021); biopsy (09/28/2021); and polypectomy (09/28/2021). Family History:  Her family history includes Kidney disease in her father; Stomach cancer in her maternal aunt. Social History:   reports that she has never smoked. She has never used smokeless tobacco. She reports that she does not drink alcohol and does not use drugs.  REVIEW OF SYSTEMS  : All other systems reviewed and negative except where noted in the History of Present Illness.   PHYSICAL EXAM: BP 112/64   Pulse 92   Ht '4\' 11"'$  (1.499 m)   Wt 247 lb 12.8 oz (112.4 kg)   BMI 50.05 kg/m  General:   Pleasant, well developed female in no acute distress Head:   Normocephalic and  atraumatic. Eyes:  scleral icterus,conjunctive pink  Heart:   regular rate and rhythm Pulm:  Clear anteriorly; no wheezing Abdomen:   Soft, Obese AB, Sluggish bowel sounds. mild tenderness in the RUQ with carnett sign, no rash.  Without guarding and Without rebound, No organomegaly appreciated. Rectal: Not evaluated Extremities:  With edema. Msk: Symmetrical without gross deformities. Peripheral pulses intact.  Neurologic:  Alert and  oriented x4;  No focal deficits.  Skin:   Dry and intact without significant lesions or rashes. Psychiatric:  Cooperative. Normal mood and affect.    Vladimir Crofts, PA-C 3:06 PM

## 2021-10-29 ENCOUNTER — Ambulatory Visit (INDEPENDENT_AMBULATORY_CARE_PROVIDER_SITE_OTHER): Payer: BC Managed Care – PPO | Admitting: Physician Assistant

## 2021-10-29 ENCOUNTER — Encounter: Payer: Self-pay | Admitting: Physician Assistant

## 2021-10-29 ENCOUNTER — Other Ambulatory Visit (HOSPITAL_BASED_OUTPATIENT_CLINIC_OR_DEPARTMENT_OTHER): Payer: Self-pay

## 2021-10-29 VITALS — BP 112/64 | HR 92 | Ht 59.0 in | Wt 247.8 lb

## 2021-10-29 DIAGNOSIS — K449 Diaphragmatic hernia without obstruction or gangrene: Secondary | ICD-10-CM

## 2021-10-29 DIAGNOSIS — K219 Gastro-esophageal reflux disease without esophagitis: Secondary | ICD-10-CM

## 2021-10-29 DIAGNOSIS — R109 Unspecified abdominal pain: Secondary | ICD-10-CM

## 2021-10-29 DIAGNOSIS — Z903 Acquired absence of stomach [part of]: Secondary | ICD-10-CM

## 2021-10-29 DIAGNOSIS — Z1211 Encounter for screening for malignant neoplasm of colon: Secondary | ICD-10-CM

## 2021-10-29 MED ORDER — FAMOTIDINE 20 MG PO TABS
20.0000 mg | ORAL_TABLET | Freq: Two times a day (BID) | ORAL | 1 refills | Status: DC
  Filled 2021-10-29: qty 60, 30d supply, fill #0
  Filled 2021-12-17: qty 60, 30d supply, fill #1

## 2021-10-29 NOTE — Patient Instructions (Addendum)
If you are age 43 or younger, your body mass index should be between 19-25. Your Body mass index is 50.05 kg/m. If this is out of the aformentioned range listed, please consider follow up with your Primary Care Provider.  ________________________________________________________  The [facility] providers would like to encourage you to use [facility] to communicate with providers for non-urgent requests or questions.  Due to long hold times on the telephone, sending your provider a message by [facility] may be a faster and more efficient way to get a response.  Please allow 48 business hours for a response.  Please remember that this is for non-urgent requests.  _______________________________________________________  Will refer to surgery to discuss possible hernia repair We will referring you to [facility] Please allow 2-3 weeks for them to contact you. Address: [address] [place], [place] Phone: [phone]  Declines discussion of weight loss surgery but will discuss hernia repair Continue wegovy for weight loss, discussed gastroparesis and constipation side effects.   Please take your proton pump inhibitor medication, twice a day with pepcid  Please take this medication 30 minutes to 1 hour before meals- this makes it more effective.  Avoid spicy and acidic foods Avoid fatty foods Limit your intake of coffee, tea, alcohol, and carbonated drinks Work to maintain a healthy weight Keep the head of the bed elevated at least 3 inches with blocks or a wedge pillow if you are having any nighttime symptoms Stay upright for 2 hours after eating Avoid meals and snacks three to four hours before bedtime    You can talk with PCP about the pain in your side, may need Xray of back Ca consider physical therapy Can do tyelnol max 3000 mg a day, salon pas patches are over the counter and voltern gel is topical antiinflammatory that is safe.   Follow up as needed.  Thank  you for entrusting me with your care and choosing [facility].  Vicie Mutters, PA-C  Costochondritis  Costochondritis is irritation and swelling (inflammation) of the tissue that connects the ribs to the breastbone (sternum). This tissue is called cartilage. Costochondritis causes pain in the front of the chest. Usually, the pain: Starts slowly. Is in more than one rib.

## 2021-11-03 NOTE — Progress Notes (Signed)
I agree with assessment and plan as outlined by Ms. Silverio Lay.

## 2021-11-09 ENCOUNTER — Other Ambulatory Visit (HOSPITAL_BASED_OUTPATIENT_CLINIC_OR_DEPARTMENT_OTHER): Payer: Self-pay

## 2021-11-09 ENCOUNTER — Other Ambulatory Visit (HOSPITAL_COMMUNITY): Payer: Self-pay

## 2021-11-09 MED FILL — Olmesartan Medoxomil Tab 20 MG: ORAL | 30 days supply | Qty: 30 | Fill #0 | Status: CN

## 2021-11-09 MED FILL — Olmesartan Medoxomil Tab 20 MG: ORAL | 30 days supply | Qty: 30 | Fill #0 | Status: AC

## 2021-11-19 ENCOUNTER — Ambulatory Visit (INDEPENDENT_AMBULATORY_CARE_PROVIDER_SITE_OTHER): Payer: BC Managed Care – PPO | Admitting: Pharmacist

## 2021-11-19 ENCOUNTER — Other Ambulatory Visit (HOSPITAL_BASED_OUTPATIENT_CLINIC_OR_DEPARTMENT_OTHER): Payer: Self-pay

## 2021-11-19 ENCOUNTER — Encounter: Payer: Self-pay | Admitting: Pharmacist

## 2021-11-19 DIAGNOSIS — I10 Essential (primary) hypertension: Secondary | ICD-10-CM | POA: Diagnosis not present

## 2021-11-19 MED ORDER — WEGOVY 1.7 MG/0.75ML ~~LOC~~ SOAJ
1.7000 mg | SUBCUTANEOUS | 2 refills | Status: DC
  Filled 2021-11-19: qty 3, 28d supply, fill #0
  Filled 2021-12-16: qty 3, 28d supply, fill #1
  Filled 2022-01-21 – 2022-01-22 (×2): qty 3, 28d supply, fill #2

## 2021-11-19 NOTE — Patient Instructions (Addendum)
It was nice meeting you today  We would like to keep your blood pressure less than 130/80  You can reduce your olmesartan to '10mg'$  once a day  I have sent in refills for your Wegovy 1.'7mg'$  once a week  Please continue to watch your diet  Congratulations on your new position!  Karren Cobble, PharmD, [place], [place], [place], [place], [place], [address] Phone: [phone], Fax: [phone]

## 2021-11-19 NOTE — Progress Notes (Signed)
Patient ID: Kristina Khan                 DOB: 27-Jun-1980                    MRN: [id number]     HPI: Kristina Khan is a 41 y.o. female patient referred to pharmacy clinic by Dr Harriet Masson to initiate weight loss therapy with GLP1-RA. PMH is significant for obesity, HTN, and HLD. Most recent BMI 48.88.  Patient presents today in good spirits. Currently takes [facility] 1.'7mg'$  weekly and would like to stay on current dose.    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 She believes this will be a big stress reliever for her.  Has noticed that blood pressure has now come down considerably which she relates to having less stress. Systolic BP now consistently in 90s and she often feels tired.  Baseline weight/BMI: 50.09 although was 53.73 earlier in the year   Labs: Lab Results  Component Value Date   HGBA1C 6.1 (H) 03/26/2021    Wt Readings from Last 1 Encounters:  10/29/21 247 lb 12.8 oz (112.4 kg)    BP Readings from Last 1 Encounters:  10/29/21 112/64   Pulse Readings from Last 1 Encounters:  10/29/21 92    No results found for: "CHOL", "TRIG", "HDL", "CHOLHDL", "VLDL", "LDLCALC", "LDLDIRECT"  Past Medical History:  Diagnosis Date   GERD (gastroesophageal reflux disease)    Hashimoto's thyroiditis    Obesity    Prediabetes     Current Outpatient Medications on File Prior to Visit  Medication Sig Dispense Refill   albuterol (VENTOLIN HFA) 108 (90 Base) MCG/ACT inhaler Inhale into the lungs every 6 (six) hours as needed for wheezing or shortness of breath.     BIOTIN PO Take 1 tablet by mouth 3 (three) times a week.     Calcium Citrate-Vitamin D (CALCIUM CITRATE +D PO) Take 1 each by mouth 2 (two) times a week.     esomeprazole (NEXIUM) 40 MG capsule Take 1 capsule (40 mg total) by mouth 2 (two) times daily before a meal. 60 capsule 3   famotidine (PEPCID) 20 MG tablet Take 1 tablet (20  mg total) by mouth 2 (two) times daily. 60 tablet 1   furosemide (LASIX) 20 MG tablet Take 1 tablet (20 mg total) by mouth once a week. 13 tablet 3   gabapentin (NEURONTIN) 300 MG capsule Take 300 mg by mouth at bedtime as needed (pain).     hyoscyamine (OSCIMIN) 0.125 MG tablet Take 1 tablet (0.125 mg total) by mouth every 6 (six) hours as needed for cramping. 30 tablet 0   Multiple Vitamins-Minerals (BARIATRIC FUSION PO) Take 1 tablet by mouth in the morning and at bedtime.     olmesartan (BENICAR) 20 MG tablet TAKE 1 TABLET BY MOUTH EVERY DAY 90 tablet 1   potassium chloride (KLOR-CON) 10 MEQ tablet Take 1 tablet by mouth weekly with furosemide dose. (Patient taking differently: Take 10 mEq by mouth daily as needed (Take with Lasix). Take 1 tablet by mouth weekly with furosemide dose.) 13 tablet 3   Semaglutide-Weight Management 1 MG/0.5ML SOAJ Inject 1 mg into the skin once a week. (Patient not taking: Reported on 09/21/2021) 2 mL 0   Semaglutide-Weight Management 1.7 MG/0.75ML SOAJ Inject 1.7 mg into the skin once a week. 3 mL 0   Semaglutide-Weight Management 2.4  MG/0.75ML SOAJ Inject 2.4 mg into the skin once a week. (Patient not taking: Reported on 09/21/2021) 3 mL 0   Vitamin D, Ergocalciferol, (DRISDOL) 1.25 MG (50000 UNIT) CAPS capsule Take 50,000 Units by mouth every Tuesday.     No current facility-administered medications on file prior to visit.    Allergies  Allergen Reactions   Shellfish Allergy Anaphylaxis   Penicillins Rash     Assessment/Plan:  1. Weight loss - Patient weight today 248#, BMI 50.09 which has shown improvement throughout treatment,  Does not want to increase Wegovy to 2.'4mg'$  weekly due to possible adverse effects.  Will send in refills of 1.'7mg'$ .   Having issues with borderline hypotension. Will reduce dose of olmesartan '20mg'$  to '10mg'$  at this time. Advised to continue to monitor BP at home in case further reduction is required.  Patient voiced  understanding.  Continue VELFYB 1.'7mg'$  weekly Reduce olmesartan to '10mg'$  daily Recheck as needed  Karren Cobble, PharmD, BCACP, [place], [place], [place], [place], [address] Phone: [phone], Fax: [phone]

## 2021-12-16 ENCOUNTER — Other Ambulatory Visit (HOSPITAL_BASED_OUTPATIENT_CLINIC_OR_DEPARTMENT_OTHER): Payer: Self-pay

## 2021-12-17 ENCOUNTER — Other Ambulatory Visit (HOSPITAL_BASED_OUTPATIENT_CLINIC_OR_DEPARTMENT_OTHER): Payer: Self-pay

## 2021-12-28 ENCOUNTER — Ambulatory Visit: Payer: BC Managed Care – PPO | Admitting: Internal Medicine

## 2021-12-28 NOTE — Progress Notes (Deleted)
Patient ID: Kristina Khan, female   DOB: Oct 15, 1980, 41 y.o.   MRN: [id number]   HPI  Kristina Khan is a 41 y.o.-year-old very pleasant female, initially referred by her PCP, Elmer Ramp, Whitney L, PA ([facility]), returning for follow-up for a left thyroid nodule and Hashimoto's thyroiditis. She moved here from [place], [place], in 10/2018.  Last visit 1 year ago.  Interim history: No complaints at last visit.  She had muscle cramps and bone pain before, but.  Reviewed history: Pt. has been dx with a thyroid nodule in 04/2019 after she presented at [facility] for Weight loss. TFTs were normal, but thyroid antibodies were high, pointing towards Hashimoto's thyroiditis.  She is not on levothyroxine.  She established care with a PCP afterwards who palpated a thyroid nodule >> sent for a thyroid U/S.  Retrospectively, she remembers her PCP in [place] mentioning that she had a goiter in the past.  Thyroid ultrasound (05/14/2019): Heterogeneous thyroid with a solid, hypoechoic, left mid lobe thyroid nodule measuring 1.5 x 0.8 x 0.5 cm.  By the current guidelines, a recommendation was made for biopsy.    FNA of her left thyroid nodule (06/27/2019): Specimen Submitted:  A. THYROID, LEFT, FINE NEEDLE ASPIRATION:   FINAL MICROSCOPIC DIAGNOSIS:  - Consistent with lymphocytic (Hashimoto) thyroiditis in the proper  clinical context (Bethesda category II)   SPECIMEN ADEQUACY:  Satisfactory for evaluation   Thyroid U/S (12/31/2020): Parenchymal Echotexture: Markedly heterogenous Isthmus: Normal in size measures 0.4 cm in diameter  Right lobe: Enlarged measuring 6.5 x 1.6 x 2.3 cm, previously, 6.0 x 1.4 x 2.2 cm Left lobe: Borderline enlarged measuring 5.0 x 1.5 x 2.4 cm, previously, 5.7 x 1.5 x 1.9 cm  _________________________________________________________ The previously biopsied 1.5 x 0.9 x 0.6 cm hypoechoic nodule involving the mid, posterior aspect of the left lobe of the thyroid (labeled 1) is  unchanged compared to the 05/14/2019 examination, previously, 1.5 x 0.8 x 0.5 cm. Correlation with previous biopsy results is advised.   Adjacent to the inferior pole of the left lobe of the thyroid are too hypoechoic nodules, one measuring 1.0 x 0.6 cm and other measuring 1.0 x 0.7 cm (both nodule seen on image 27), unchanged compared to the 05/14/2019 examination   IMPRESSION: 1. Similar appearing enlarged and markedly heterogeneous thyroid without worrisome new or enlarging thyroid nodule. 2. Previously biopsied solitary left-sided thyroid nodule is unchanged compared to the 05/14/2019 examination. Correlation with previous biopsy results is advised. Assuming a benign pathologic diagnosis, repeat sampling and/or continued dedicated follow-up is not recommended. 3. Adjacent to the inferior pole of the left lobe of the thyroid are two hypoechoic nodules, unchanged compared to the 05/14/2019 examination with differential considerations including non pathologically enlarged cervical lymph nodes versus parathyroid adenomas. Clinical correlation is advised. Further evaluation with nuclear medicine parathyroid scintigraphy and/or contrast-enhanced neck CT could be performed as indicated.  Reviewed her TFTs: Lab Results  Component Value Date   TSH 1.98 12/25/2020   TSH 1.13 12/24/2019   FREET4 0.94 12/25/2020   FREET4 1.08 12/24/2019  04/28/2019: TSH 1.604, fT4 1.02 (0.7-1.48), fT3 2.1 (2-4.1), TPO antibody 713 (0-6)  Review latest calcium, PTH, and vitamin D: Lab Results  Component Value Date   PTH 34 01/07/2021   PTH Comment 01/07/2021   CALCIUM 9.5 08/21/2021   CALCIUM 9.4 08/19/2021   CALCIUM 9.5 03/26/2021   CALCIUM 9.0 01/07/2021   CALCIUM 9.1 10/26/2007   Lab Results  Component Value Date   VD25OH 46.2  01/07/2021  She takes ergocalciferol 50,000 units weekly.  Pt denies: - feeling nodules in neck - hoarseness - dysphagia - choking - SOB with lying down - +  dyscomfort with lying down on R side. - + clearing her throat more in last 2 months.  She has + FH of thyroid disorders in: MGM (goiter), M aunt. No FH of thyroid cancer. No h/o radiation tx to head or neck.  No herbal supplements. S previously on biotin, now off. + steroids use in 10/2020.   She has a history of gastric sleeve surgery in 2017.  Her weight decreased to 212 pounds, but since then, she gained a significant amount of back. She takes as needed Neurontin for neuropathy. Also prev. on naltrexone >> now off.  ROS: + See HPI  I reviewed pt's medications, allergies, PMH, social hx, family hx, and changes were documented in the history of present illness. Otherwise, unchanged from my initial visit note.  Past Medical History:  Diagnosis Date   GERD (gastroesophageal reflux disease)    Hashimoto's thyroiditis    Obesity    Prediabetes     Social History   Socioeconomic History   Marital status: Single    Spouse name: Not on file   Number of children: Not on file   Years of education: Not on file   Highest education level: Not on file  Occupational History   Not on file  Tobacco Use   Smoking status: Never   Smokeless tobacco: Never  Substance and Sexual Activity   Alcohol use: Never   Drug use: Never   Sexual activity: Not on file  Other Topics Concern   Not on file  Social History Narrative   Not on file   Social Determinants of Health   Financial Resource Strain: Not on file  Food Insecurity: Not on file  Transportation Needs: Not on file  Physical Activity: Not on file  Stress: Not on file  Social Connections: Not on file  Intimate Partner Violence: Not on file   Current Outpatient Medications on File Prior to Visit  Medication Sig Dispense Refill   albuterol (VENTOLIN HFA) 108 (90 Base) MCG/ACT inhaler Inhale into the lungs every 6 (six) hours as needed for wheezing or shortness of breath.     BIOTIN PO Take 1 tablet by mouth 3 (three) times a week.      Calcium Citrate-Vitamin D (CALCIUM CITRATE +D PO) Take 1 each by mouth 2 (two) times a week.     esomeprazole (NEXIUM) 40 MG capsule Take 1 capsule (40 mg total) by mouth 2 (two) times daily before a meal. 60 capsule 3   famotidine (PEPCID) 20 MG tablet Take 1 tablet (20 mg total) by mouth 2 (two) times daily. 60 tablet 1   furosemide (LASIX) 20 MG tablet Take 1 tablet (20 mg total) by mouth once a week. 13 tablet 3   gabapentin (NEURONTIN) 300 MG capsule Take 300 mg by mouth at bedtime as needed (pain).     hyoscyamine (OSCIMIN) 0.125 MG tablet Take 1 tablet (0.125 mg total) by mouth every 6 (six) hours as needed for cramping. 30 tablet 0   Multiple Vitamins-Minerals (BARIATRIC FUSION PO) Take 1 tablet by mouth in the morning and at bedtime.     olmesartan (BENICAR) 20 MG tablet Take 0.5 tablets (10 mg total) by mouth daily. 90 tablet 1   potassium chloride (KLOR-CON) 10 MEQ tablet Take 1 tablet by mouth weekly with furosemide dose. (Patient taking  differently: Take 10 mEq by mouth daily as needed (Take with Lasix). Take 1 tablet by mouth weekly with furosemide dose.) 13 tablet 3   Semaglutide-Weight Management (WEGOVY) 1.7 MG/0.75ML SOAJ Inject 1.7 mg into the skin once a week. 3 mL 2   Semaglutide-Weight Management 2.4 MG/0.75ML SOAJ Inject 2.4 mg into the skin once a week. (Patient not taking: Reported on 09/21/2021) 3 mL 0   Vitamin D, Ergocalciferol, (DRISDOL) 1.25 MG (50000 UNIT) CAPS capsule Take 50,000 Units by mouth every Tuesday.     No current facility-administered medications on file prior to visit.   Allergies  Allergen Reactions   Shellfish Allergy Anaphylaxis   Penicillins Rash   Family History  Problem Relation Age of Onset   Kidney disease Father    Stomach cancer Maternal Aunt     PE: There were no vitals taken for this visit. Wt Readings from Last 3 Encounters:  11/19/21 242 lb (109.8 kg)  10/29/21 247 lb 12.8 oz (112.4 kg)  09/28/21 248 lb (112.5 kg)    Constitutional: overweight, in NAD Eyes:  EOMI, no exophthalmos ENT: no neck masses, + palpable thyroid, no cervical lymphadenopathy Cardiovascular: RRR, No MRG Respiratory: CTA B Musculoskeletal: no deformities Skin:no rashes Neurological: no tremor with outstretched hands   ASSESSMENT: 1. L thyroid nodule  2. Hashimoto thyroiditis  PLAN: 1.  Left thyroid nodule -Patient with history of left thyroid nodule that was not large, did not contain microcalcifications or internal blood flow, it was not taller than wide it did not have irregular margins.  However, the nodule was hypoechoic.  We did biopsied in 06/2019 with benign results.  Repeat thyroid ultrasound obtained in 12/2020 showed 2 additional nodules, which appeared to be hypoechoic and measuring 1 cm each, possibly lymph nodes versus parathyroid adenomas.  After the above results returned, I asked the patient to come back for a vitamin D, PTH and calcium check.  All of these were normal.  She also had another calcium level that was normal 08/2021.  Therefore, my suspicion for parathyroid adenomas is low.  We will continue to keep an eye on this area at future ultrasounds but -Note, she does not have a family history of thyroid cancer or personal history of radiation therapy to head or neck to put her at a particular high risk for thyroid cancer -At last visit, she had some neck pressure especially when she was lying on her right side but no problems with swallowing or any choking. -Does have a history of elevated thyroid antibodies, confirming Hashimoto's thyroiditis and I explained that this can at times cause neck pressure due to increased inflammation. -Plan to repeat another ultrasound this year  2. Hashimoto thyroiditis -On ultrasound, the thyroid appears heterogeneous, indicative of thyroiditis -TFTs were normal, so she did not require levothyroxine yet -We tried selenium in the past but she stopped that she developed muscle  aches -At last visit, she complained of a low muscle cramps, and also increased hunger, bone pain (fingers), hair loss along with chronic constipation and cold intolerance.  Since TFTs were normal at that time, I advised her to see PCP for a more general evaluation. -We will recheck her TFTs now -I will see her back in a year  Philemon Kingdom, MD PhD [facility] Endocrinology

## 2021-12-30 ENCOUNTER — Other Ambulatory Visit (HOSPITAL_BASED_OUTPATIENT_CLINIC_OR_DEPARTMENT_OTHER): Payer: Self-pay

## 2021-12-31 ENCOUNTER — Other Ambulatory Visit (HOSPITAL_BASED_OUTPATIENT_CLINIC_OR_DEPARTMENT_OTHER): Payer: Self-pay

## 2022-01-11 ENCOUNTER — Other Ambulatory Visit: Payer: Self-pay | Admitting: Surgery

## 2022-01-11 DIAGNOSIS — R1011 Right upper quadrant pain: Secondary | ICD-10-CM

## 2022-01-19 ENCOUNTER — Other Ambulatory Visit: Payer: Self-pay | Admitting: Surgery

## 2022-01-19 ENCOUNTER — Ambulatory Visit
Admission: RE | Admit: 2022-01-19 | Discharge: 2022-01-19 | Disposition: A | Discharge status: Home / Self Care | Payer: BC Managed Care – PPO | Source: Ambulatory Visit | Attending: Surgery | Admitting: Surgery

## 2022-01-19 DIAGNOSIS — R1011 Right upper quadrant pain: Secondary | ICD-10-CM

## 2022-01-22 ENCOUNTER — Other Ambulatory Visit (HOSPITAL_COMMUNITY): Payer: Self-pay

## 2022-01-22 ENCOUNTER — Other Ambulatory Visit (HOSPITAL_BASED_OUTPATIENT_CLINIC_OR_DEPARTMENT_OTHER): Payer: Self-pay

## 2022-02-16 ENCOUNTER — Other Ambulatory Visit: Payer: Self-pay | Admitting: Cardiology

## 2022-02-16 ENCOUNTER — Other Ambulatory Visit (HOSPITAL_BASED_OUTPATIENT_CLINIC_OR_DEPARTMENT_OTHER): Payer: Self-pay

## 2022-02-16 ENCOUNTER — Other Ambulatory Visit: Payer: Self-pay | Admitting: Physician Assistant

## 2022-02-16 DIAGNOSIS — I10 Essential (primary) hypertension: Secondary | ICD-10-CM

## 2022-02-16 MED ORDER — WEGOVY 1.7 MG/0.75ML ~~LOC~~ SOAJ
1.7000 mg | SUBCUTANEOUS | 2 refills | Status: AC
  Filled 2022-02-16 – 2022-05-13 (×4): qty 3, 28d supply, fill #0

## 2022-02-16 MED ORDER — FAMOTIDINE 20 MG PO TABS
20.0000 mg | ORAL_TABLET | Freq: Two times a day (BID) | ORAL | 3 refills | Status: AC
  Filled 2022-02-16: qty 75, 38d supply, fill #0
  Filled 2022-02-16: qty 105, 52d supply, fill #0
  Filled 2022-05-13 – 2022-09-17 (×2): qty 180, 90d supply, fill #1

## 2022-02-17 ENCOUNTER — Other Ambulatory Visit (HOSPITAL_COMMUNITY): Payer: Self-pay

## 2022-02-17 ENCOUNTER — Other Ambulatory Visit (HOSPITAL_BASED_OUTPATIENT_CLINIC_OR_DEPARTMENT_OTHER): Payer: Self-pay

## 2022-02-18 ENCOUNTER — Other Ambulatory Visit (HOSPITAL_BASED_OUTPATIENT_CLINIC_OR_DEPARTMENT_OTHER): Payer: Self-pay

## 2022-02-18 ENCOUNTER — Other Ambulatory Visit (HOSPITAL_COMMUNITY): Payer: Self-pay

## 2022-02-22 ENCOUNTER — Other Ambulatory Visit (HOSPITAL_BASED_OUTPATIENT_CLINIC_OR_DEPARTMENT_OTHER): Payer: Self-pay

## 2022-02-22 ENCOUNTER — Encounter: Payer: Self-pay | Admitting: Pharmacist Clinician (PhC)/ Clinical Pharmacy Specialist

## 2022-02-23 ENCOUNTER — Other Ambulatory Visit (HOSPITAL_BASED_OUTPATIENT_CLINIC_OR_DEPARTMENT_OTHER): Payer: Self-pay

## 2022-02-23 ENCOUNTER — Telehealth: Payer: BC Managed Care – PPO | Admitting: Physician Assistant

## 2022-02-23 ENCOUNTER — Encounter: Payer: Self-pay | Admitting: Family Medicine

## 2022-02-23 DIAGNOSIS — H1033 Unspecified acute conjunctivitis, bilateral: Secondary | ICD-10-CM | POA: Diagnosis not present

## 2022-02-23 MED ORDER — POLYMYXIN B-TRIMETHOPRIM 10000-0.1 UNIT/ML-% OP SOLN
OPHTHALMIC | 0 refills | Status: AC
  Filled 2022-02-23: qty 10, 5d supply, fill #0

## 2022-02-23 NOTE — Progress Notes (Signed)
I have spent 5 minutes in review of e-visit questionnaire, review and updating patient chart, medical decision making and response to patient.   Taunja Brickner Cody Tysheem Accardo, PA-C    

## 2022-02-23 NOTE — Progress Notes (Signed)
E-Visit for [employer]   We are sorry that you are not feeling well.  Here is how we plan to help!  Based on what you have shared with me it looks like you have conjunctivitis.  Conjunctivitis is a common inflammatory or infectious condition of the eye that is often referred to as "pink eye".  In most cases it is contagious (viral or bacterial). However, not all conjunctivitis requires antibiotics (ex. Allergic).  We have made appropriate suggestions for you based upon your presentation.  I have prescribed Polytrim Ophthalmic drops 1-2 drops 4 times a day times 5 days. Keep up with allergy medication and allergy eye drops as well.   Pink eye can be highly contagious.  It is typically spread through direct contact with secretions, or contaminated objects or surfaces that one may have touched.  Strict handwashing is suggested with soap and water is urged.  If not available, use alcohol based had sanitizer.  Avoid unnecessary touching of the eye.  If you wear contact lenses, you will need to refrain from wearing them until you see no white discharge from the eye for at least 24 hours after being on medication.  You should see symptom improvement in 1-2 days after starting the medication regimen.  Call us if symptoms are not improved in 1-2 days.  Home Care: Wash your hands often! Do not wear your contacts until you complete your treatment plan. Avoid sharing towels, bed linen, personal items with a person who has pink eye. See attention for anyone in your home with similar symptoms.  Get Help Right Away If: Your symptoms do not improve. You develop blurred or loss of vision. Your symptoms worsen (increased discharge, pain or redness)   Thank you for choosing an e-visit.  Your e-visit answers were reviewed by a board certified advanced clinical practitioner to complete your personal care plan. Depending upon the condition, your plan could have included both over the counter or prescription  medications.  Please review your pharmacy choice. Make sure the pharmacy is open so you can pick up prescription now. If there is a problem, you may contact your provider through [employer] and have the prescription routed to another pharmacy.  Your safety is important to [place]. If you have drug allergies check your prescription carefully.   For the next 24 hours you can use MyChart to ask questions about today's visit, request a non-urgent call back, or ask for a work or school excuse. You will get an email in the next two days asking about your experience. I hope that your e-visit has been valuable and will speed your recovery.

## 2022-02-23 NOTE — Telephone Encounter (Signed)
Patient came by office today to sign appeal letter for [facility].    Weighed in office to show benefit of Wegovy to date:  Started after 07/09/21 OV -weight at that time 119.9 kg  Today 02/23/22 weight - 102.3 kg  Weight loss to date 17.6 kg  Percentage weight loss: 14.7%

## 2022-02-26 ENCOUNTER — Other Ambulatory Visit (HOSPITAL_BASED_OUTPATIENT_CLINIC_OR_DEPARTMENT_OTHER): Payer: Self-pay

## 2022-03-01 ENCOUNTER — Encounter: Payer: Self-pay | Admitting: Pharmacist Clinician (PhC)/ Clinical Pharmacy Specialist

## 2022-03-08 ENCOUNTER — Encounter: Payer: Self-pay | Admitting: Pharmacist Clinician (PhC)/ Clinical Pharmacy Specialist

## 2022-03-12 ENCOUNTER — Other Ambulatory Visit (HOSPITAL_BASED_OUTPATIENT_CLINIC_OR_DEPARTMENT_OTHER): Payer: Self-pay

## 2022-04-15 ENCOUNTER — Other Ambulatory Visit (HOSPITAL_BASED_OUTPATIENT_CLINIC_OR_DEPARTMENT_OTHER): Payer: Self-pay

## 2022-05-13 ENCOUNTER — Other Ambulatory Visit: Payer: Self-pay | Admitting: Physician Assistant

## 2022-05-13 ENCOUNTER — Other Ambulatory Visit: Payer: Self-pay

## 2022-05-13 ENCOUNTER — Other Ambulatory Visit (HOSPITAL_BASED_OUTPATIENT_CLINIC_OR_DEPARTMENT_OTHER): Payer: Self-pay

## 2022-05-13 DIAGNOSIS — R1010 Upper abdominal pain, unspecified: Secondary | ICD-10-CM

## 2022-05-13 MED ORDER — ESOMEPRAZOLE MAGNESIUM 40 MG PO CPDR
40.0000 mg | DELAYED_RELEASE_CAPSULE | Freq: Two times a day (BID) | ORAL | 3 refills | Status: DC
  Filled 2022-05-13 – 2022-06-30 (×3): qty 60, 30d supply, fill #0
  Filled 2022-08-13 – 2022-08-25 (×2): qty 60, 30d supply, fill #1
  Filled 2022-09-17 – 2022-11-13 (×3): qty 60, 30d supply, fill #2
  Filled 2023-02-02: qty 60, 30d supply, fill #3

## 2022-05-17 ENCOUNTER — Telehealth: Payer: Self-pay

## 2022-05-17 ENCOUNTER — Other Ambulatory Visit (HOSPITAL_BASED_OUTPATIENT_CLINIC_OR_DEPARTMENT_OTHER): Payer: Self-pay

## 2022-05-17 NOTE — Telephone Encounter (Signed)
Pharmacy Patient Advocate Encounter   Received notification from [facility] that prior authorization for [facility] 1.'7mg'$ /ml is required/requested.     PA submitted on 05/17/22 to (ins) [employer] via CoverMyMeds Key [id number] Status is pending

## 2022-05-18 NOTE — Telephone Encounter (Signed)
Pharmacy Patient Advocate Encounter  Prior Authorization for Wegovy 1.'7mg'$ /0.61mhas been approved.    PA# [id number] dates: 2.5.24 through 2.5.25

## 2022-05-24 ENCOUNTER — Other Ambulatory Visit (HOSPITAL_BASED_OUTPATIENT_CLINIC_OR_DEPARTMENT_OTHER): Payer: Self-pay

## 2022-06-16 ENCOUNTER — Other Ambulatory Visit (HOSPITAL_BASED_OUTPATIENT_CLINIC_OR_DEPARTMENT_OTHER): Payer: Self-pay

## 2022-06-23 ENCOUNTER — Other Ambulatory Visit (HOSPITAL_BASED_OUTPATIENT_CLINIC_OR_DEPARTMENT_OTHER): Payer: Self-pay

## 2022-06-23 ENCOUNTER — Telehealth: Payer: BC Managed Care – PPO | Admitting: Nurse Practitioner

## 2022-06-23 DIAGNOSIS — R21 Rash and other nonspecific skin eruption: Secondary | ICD-10-CM

## 2022-06-23 MED ORDER — PREDNISONE 10 MG PO TABS
ORAL_TABLET | ORAL | 0 refills | Status: AC
  Filled 2022-06-23: qty 37, 14d supply, fill #0

## 2022-06-23 NOTE — Progress Notes (Signed)
E Visit for Rash  We are sorry that you are not feeling well. Here is how we plan to help!  Based on what you shared with me it looks like you have contact dermatitis.  Contact dermatitis is a skin rash caused by something that touches the skin and causes irritation or inflammation.  Your skin may be red, swollen, dry, cracked, and itch.  The rash should go away in a few days but can last a few weeks.  If you get a rash, it's important to figure out what caused it so the irritant can be avoided in the future.  If you are not already taking a daily allergy pill like Zyrtec or Claritin please start one of those daily  Stop applying anything topical aside from Vaseline if needed for comfort, otherwise leave the area open to rest. Wash only with warm water   We will prescribe a taper of prednisone to help resolve the allergic response that occurred, this medication needs to be taken with food daily.   Meds ordered this encounter  Medications   predniSONE (DELTASONE) 10 MG tablet    Sig: Take 4 tablets ('40mg'$ ) on days 1-4, then 3 tablets ('30mg'$ ) on days 5-8, then 2 tablets ('20mg'$ ) on days 9-11, then 1 tablet daily for days 12-14. Take with food.    Dispense:  37 tablet    Refill:  0       GET HELP RIGHT AWAY IF:  Symptoms don't go away after treatment. Severe itching that persists. If you rash spreads or swells. If you rash begins to smell. If it blisters and opens or develops a yellow-brown crust. You develop a fever. You have a sore throat. You become short of breath.  MAKE SURE YOU:  Understand these instructions. Will watch your condition. Will get help right away if you are not doing well or get worse.  Thank you for choosing an e-visit.  Your e-visit answers were reviewed by a board certified advanced clinical practitioner to complete your personal care plan. Depending upon the condition, your plan could have included both over the counter or prescription medications.  Please  review your pharmacy choice. Make sure the pharmacy is open so you can pick up prescription now. If there is a problem, you may contact your provider through [employer] and have the prescription routed to another pharmacy.  Your safety is important to [place]. If you have drug allergies check your prescription carefully.   For the next 24 hours you can use MyChart to ask questions about today's visit, request a non-urgent call back, or ask for a work or school excuse. You will get an email in the next two days asking about your experience. I hope that your e-visit has been valuable and will speed your recovery.   I spent approximately 5 minutes reviewing the patient's history, current symptoms and coordinating their care today.

## 2022-06-30 ENCOUNTER — Other Ambulatory Visit (HOSPITAL_BASED_OUTPATIENT_CLINIC_OR_DEPARTMENT_OTHER): Payer: Self-pay

## 2022-07-14 ENCOUNTER — Telehealth: Payer: BC Managed Care – PPO | Admitting: Nurse Practitioner

## 2022-07-14 DIAGNOSIS — R058 Other specified cough: Secondary | ICD-10-CM

## 2022-07-14 DIAGNOSIS — Z8709 Personal history of other diseases of the respiratory system: Secondary | ICD-10-CM

## 2022-07-14 DIAGNOSIS — R0982 Postnasal drip: Secondary | ICD-10-CM | POA: Diagnosis not present

## 2022-07-14 MED ORDER — ALBUTEROL SULFATE HFA 108 (90 BASE) MCG/ACT IN AERS
2.0000 | INHALATION_SPRAY | Freq: Four times a day (QID) | RESPIRATORY_TRACT | 0 refills | Status: AC | PRN
  Filled 2022-07-14: qty 6.7, 25d supply, fill #0

## 2022-07-14 MED ORDER — IPRATROPIUM BROMIDE 0.03 % NA SOLN
2.0000 | Freq: Two times a day (BID) | NASAL | 12 refills | Status: AC
  Filled 2022-07-14: qty 30, 75d supply, fill #0
  Filled 2023-02-02: qty 30, 75d supply, fill #1
  Filled 2023-04-19: qty 30, 75d supply, fill #2

## 2022-07-14 MED ORDER — BENZONATATE 100 MG PO CAPS
100.0000 mg | ORAL_CAPSULE | Freq: Three times a day (TID) | ORAL | 0 refills | Status: AC | PRN
  Filled 2022-07-14: qty 30, 10d supply, fill #0

## 2022-07-14 NOTE — Progress Notes (Signed)
Virtual Visit Consent   Kristina Khan, you are scheduled for a virtual visit with a [facility] provider today. Just as with appointments in the office, your consent must be obtained to participate. Your consent will be active for this visit and any virtual visit you may have with one of our providers in the next 365 days. If you have a MyChart account, a copy of this consent can be sent to you electronically.  As this is a virtual visit, video technology does not allow for your provider to perform a traditional examination. This may limit your provider's ability to fully assess your condition. If your provider identifies any concerns that need to be evaluated in person or the need to arrange testing (such as labs, EKG, etc.), we will make arrangements to do so. Although advances in technology are sophisticated, we cannot ensure that it will always work on either your end or our end. If the connection with a video visit is poor, the visit may have to be switched to a telephone visit. With either a video or telephone visit, we are not always able to ensure that we have a secure connection.  By engaging in this virtual visit, you consent to the provision of healthcare and authorize for your insurance to be billed (if applicable) for the services provided during this visit. Depending on your insurance coverage, you may receive a charge related to this service.  I need to obtain your verbal consent now. Are you willing to proceed with your visit today? Kristina Khan has provided verbal consent on 07/14/2022 for a virtual visit (video or telephone). Kristina Schneiders, FNP  Date: 07/14/2022 6:40 PM  Virtual Visit via Video Note   I, Kristina Khan, connected with  Kristina Khan  ([id number], 13-Jan-1981) on 07/14/22 at  6:45 PM EDT by a video-enabled telemedicine application and verified that I am speaking with the correct person using two identifiers.  Location: Patient: Virtual Visit Location  Patient: Home Provider: Virtual Visit Location Provider: Home Office   I discussed the limitations of evaluation and management by telemedicine and the availability of in person appointments. The patient expressed understanding and agreed to proceed.    History of Present Illness: Kristina Khan is a 42 y.o. 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   Neuropathy 07/23/2020   Hyperlipidemia 07/23/2020   History of sleeve gastrectomy 07/23/2020   Hashimoto's thyroiditis 07/23/2020   Gastroesophageal reflux disease 07/23/2020   Seasonal allergic rhinitis 05/26/2020   ETD (Eustachian tube dysfunction), bilateral 05/26/2020    Allergies:  Allergies  Allergen Reactions   Shellfish Allergy Anaphylaxis   Penicillins Rash   Medications:  Current Outpatient Medications:    albuterol (VENTOLIN HFA) 108 (90 Base) MCG/ACT inhaler, Inhale into the lungs every 6 (six) hours as needed for wheezing or shortness of breath., Disp: , Rfl:  BIOTIN PO, Take 1 tablet by mouth 3 (three) times a week., Disp: , Rfl:    Calcium Citrate-Vitamin D (CALCIUM CITRATE +D PO), Take 1 each by mouth 2 (two) times a week., Disp: , Rfl:    esomeprazole (NEXIUM) 40 MG capsule, Take 1 capsule (40 mg total) by mouth 2 (two) times daily before a meal., Disp: 60 capsule, Rfl: 3   famotidine (PEPCID) 20 MG tablet, Take 1 tablet (20 mg total) by mouth 2 (two) times daily., Disp: 180 tablet, Rfl: 3    furosemide (LASIX) 20 MG tablet, Take 1 tablet (20 mg total) by mouth once a week., Disp: 13 tablet, Rfl: 3   gabapentin (NEURONTIN) 300 MG capsule, Take 300 mg by mouth at bedtime as needed (pain)., Disp: , Rfl:    hyoscyamine (OSCIMIN) 0.125 MG tablet, Take 1 tablet (0.125 mg total) by mouth every 6 (six) hours as needed for cramping., Disp: 30 tablet, Rfl: 0   Multiple Vitamins-Minerals (BARIATRIC FUSION PO), Take 1 tablet by mouth in the morning and at bedtime., Disp: , Rfl:    olmesartan (BENICAR) 20 MG tablet, Take 0.5 tablets (10 mg total) by mouth daily., Disp: 90 tablet, Rfl: 1   potassium chloride (KLOR-CON) 10 MEQ tablet, Take 1 tablet by mouth weekly with furosemide dose. (Patient taking differently: Take 10 mEq by mouth daily as needed (Take with Lasix). Take 1 tablet by mouth weekly with furosemide dose.), Disp: 13 tablet, Rfl: 3   predniSONE (DELTASONE) 10 MG tablet, Take 4 tablets (40mg ) on days 1-4, then 3 tablets (30mg ) on days 5-8, then 2 tablets (20mg ) on days 9-11, then 1 tablet daily for days 12-14. Take with food., Disp: 37 tablet, Rfl: 0   Semaglutide-Weight Management (WEGOVY) 1.7 MG/0.75ML SOAJ, Inject 1.7 mg into the skin once a week., Disp: 3 mL, Rfl: 2   Semaglutide-Weight Management 2.4 MG/0.75ML SOAJ, Inject 2.4 mg into the skin once a week. (Patient not taking: Reported on 09/21/2021), Disp: 3 mL, Rfl: 0   trimethoprim-polymyxin b (POLYTRIM) ophthalmic solution, Instill 1-2 drops into affected eye four times daily x 5 days., Disp: 10 mL, Rfl: 0   Vitamin D, Ergocalciferol, (DRISDOL) 1.25 MG (50000 UNIT) CAPS capsule, Take 50,000 Units by mouth every Tuesday., Disp: , Rfl:   Observations/Objective: Patient is well-developed, well-nourished in no acute distress.  Resting comfortably  at home.  Head is normocephalic, atraumatic.  No labored breathing.  Speech is clear and coherent with logical content.  Patient is alert and oriented at baseline.    Assessment and  Plan: 1. Allergic cough  - benzonatate (TESSALON) 100 MG capsule; Take 1 capsule (100 mg total) by mouth 3 (three) times daily as needed.  Dispense: 30 capsule; Refill: 0  2. Post-nasal drainage  - ipratropium (ATROVENT) 0.03 % nasal spray; Place 2 sprays into both nostrils every 12 (twelve) hours.  Dispense: 30 mL; Refill: 12  3. History of asthma  - albuterol (VENTOLIN HFA) 108 (90 Base) MCG/ACT inhaler; Inhale 2 puffs into the lungs every 6 (six) hours as needed for wheezing or shortness of breath.  Dispense: 8 g; Refill: 0     Follow Up Instructions: I discussed the assessment and treatment plan with the patient. The patient was provided an opportunity to ask questions and all were answered. The patient agreed with the plan and demonstrated an understanding of the instructions.  A copy of instructions were sent to the patient via MyChart unless otherwise noted below.    The patient  was advised to call back or seek an in-person evaluation if the symptoms worsen or if the condition fails to improve as anticipated.  Time:  I spent 15 minutes with the patient via telehealth technology discussing the above problems/concerns.    Kristina Schneiders, FNP

## 2022-07-15 ENCOUNTER — Other Ambulatory Visit (HOSPITAL_BASED_OUTPATIENT_CLINIC_OR_DEPARTMENT_OTHER): Payer: Self-pay

## 2022-08-13 ENCOUNTER — Other Ambulatory Visit: Payer: Self-pay | Admitting: Cardiology

## 2022-08-13 ENCOUNTER — Other Ambulatory Visit (HOSPITAL_BASED_OUTPATIENT_CLINIC_OR_DEPARTMENT_OTHER): Payer: Self-pay

## 2022-08-13 MED ORDER — POTASSIUM CHLORIDE ER 10 MEQ PO TBCR
EXTENDED_RELEASE_TABLET | ORAL | 3 refills | Status: AC
  Filled 2022-08-13 – 2022-08-25 (×2): qty 4, 28d supply, fill #0
  Filled 2022-09-17: qty 4, 28d supply, fill #1

## 2022-08-13 MED ORDER — FUROSEMIDE 20 MG PO TABS
20.0000 mg | ORAL_TABLET | ORAL | 3 refills | Status: AC
  Filled 2022-08-13 – 2022-08-25 (×2): qty 4, 28d supply, fill #0
  Filled 2022-09-17: qty 4, 28d supply, fill #1

## 2022-08-20 ENCOUNTER — Other Ambulatory Visit (HOSPITAL_BASED_OUTPATIENT_CLINIC_OR_DEPARTMENT_OTHER): Payer: Self-pay

## 2022-08-25 ENCOUNTER — Other Ambulatory Visit (HOSPITAL_BASED_OUTPATIENT_CLINIC_OR_DEPARTMENT_OTHER): Payer: Self-pay

## 2022-10-04 ENCOUNTER — Other Ambulatory Visit (HOSPITAL_BASED_OUTPATIENT_CLINIC_OR_DEPARTMENT_OTHER): Payer: Self-pay

## 2022-10-05 ENCOUNTER — Other Ambulatory Visit (HOSPITAL_BASED_OUTPATIENT_CLINIC_OR_DEPARTMENT_OTHER): Payer: Self-pay

## 2022-10-05 MED ORDER — FLUTICASONE PROPIONATE 50 MCG/ACT NA SUSP
NASAL | 5 refills | Status: AC
  Filled 2022-10-05 – 2023-02-02 (×2): qty 16, 30d supply, fill #0

## 2022-10-16 ENCOUNTER — Other Ambulatory Visit (HOSPITAL_BASED_OUTPATIENT_CLINIC_OR_DEPARTMENT_OTHER): Payer: Self-pay

## 2022-11-13 ENCOUNTER — Other Ambulatory Visit (HOSPITAL_BASED_OUTPATIENT_CLINIC_OR_DEPARTMENT_OTHER): Payer: Self-pay

## 2022-12-14 ENCOUNTER — Other Ambulatory Visit (HOSPITAL_BASED_OUTPATIENT_CLINIC_OR_DEPARTMENT_OTHER): Payer: Self-pay

## 2022-12-14 MED ORDER — MOXIFLOXACIN HCL 0.5 % OP SOLN
1.0000 [drp] | Freq: Three times a day (TID) | OPHTHALMIC | 0 refills | Status: AC
  Filled 2022-12-14: qty 3, 20d supply, fill #0

## 2022-12-26 IMAGING — DX DG KNEE AP/LAT W/ SUNRISE*L*
4 series · 4 of 4 positions shown · non-contrast
Comparison: None.

CLINICAL DATA: Status post fall.  Injured left knee.

EXAM:
LEFT KNEE 3 VIEWS

[knee ap]
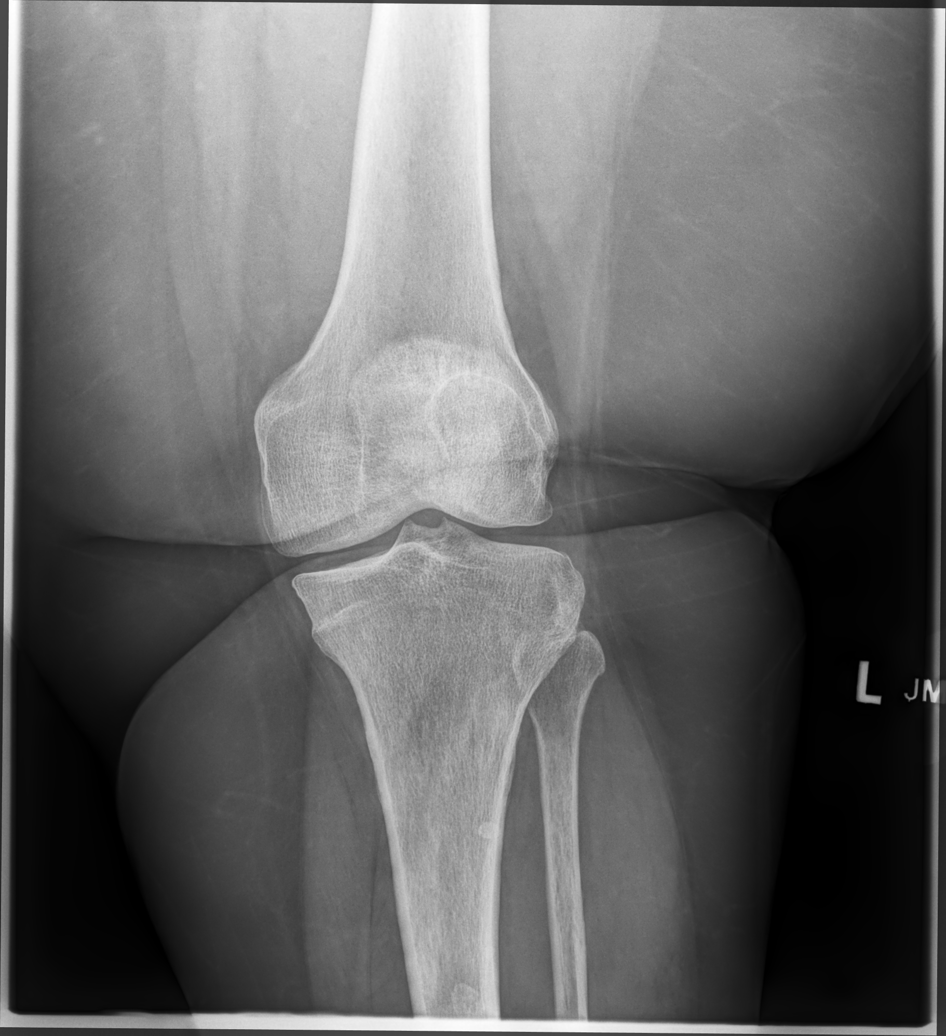

[knee lmo]
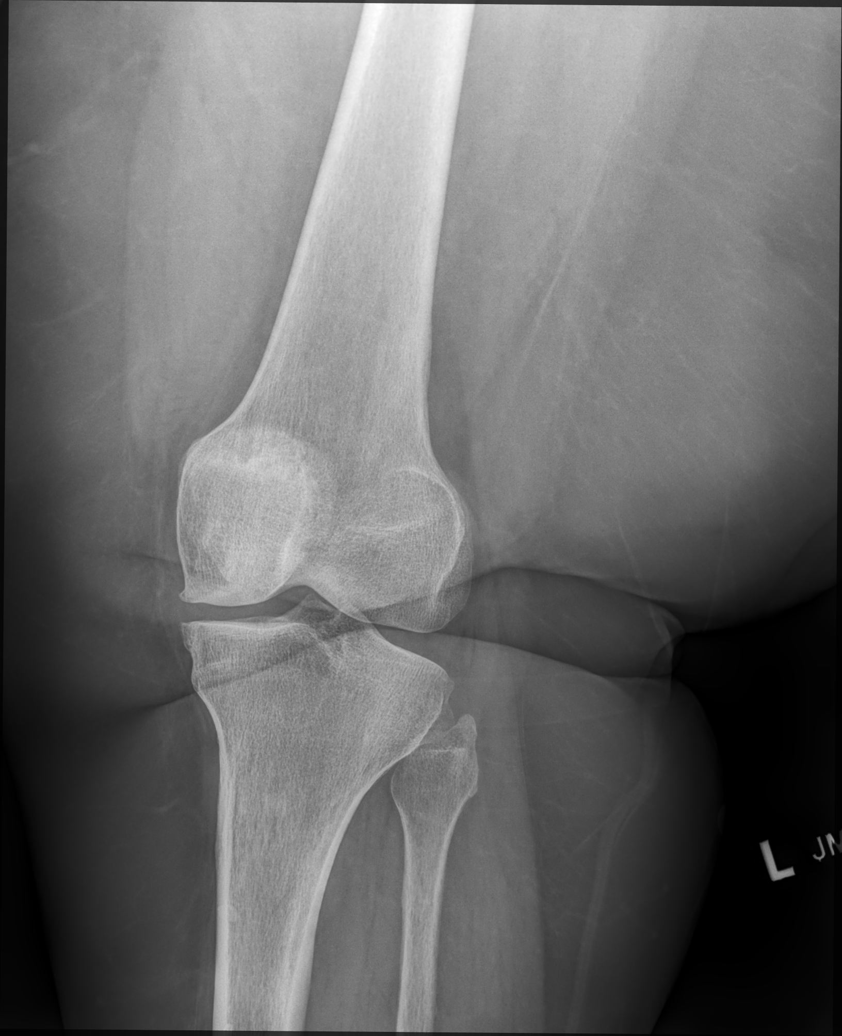

[knee lat]
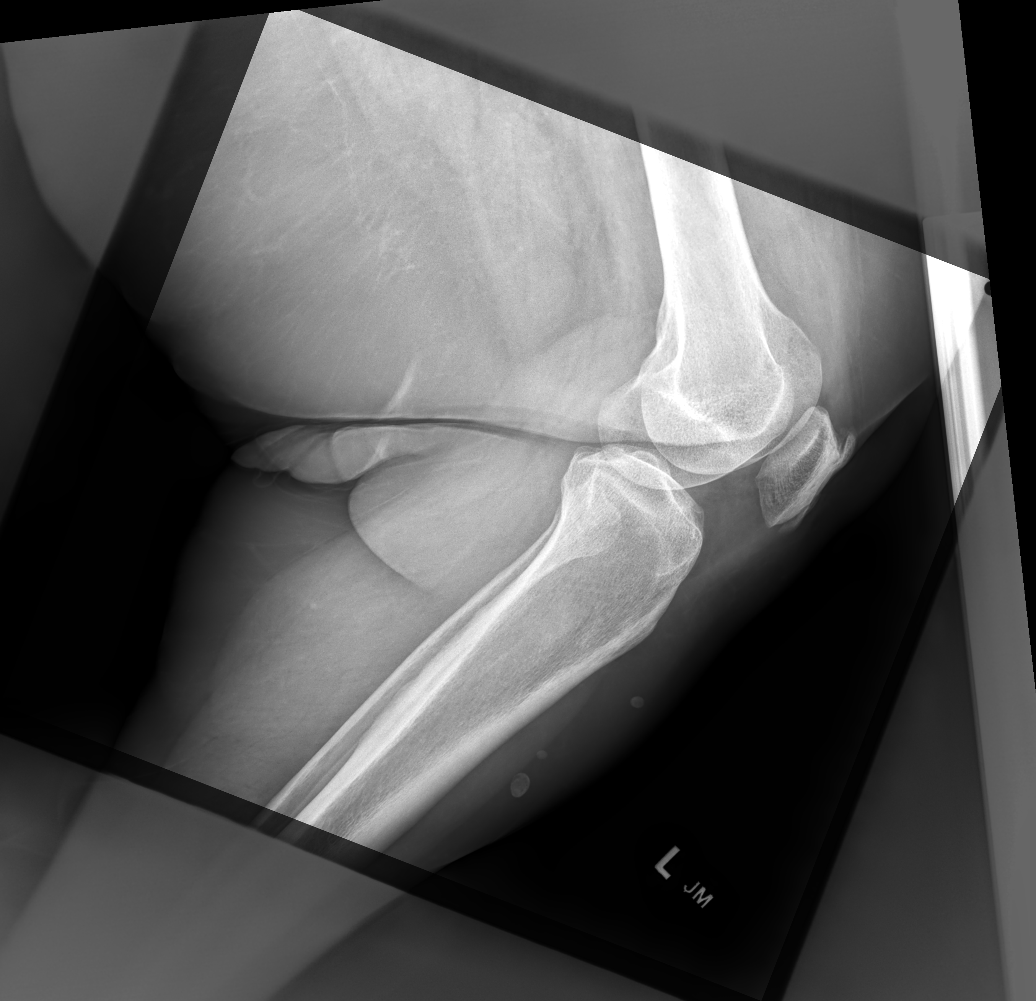

[patella tangential]
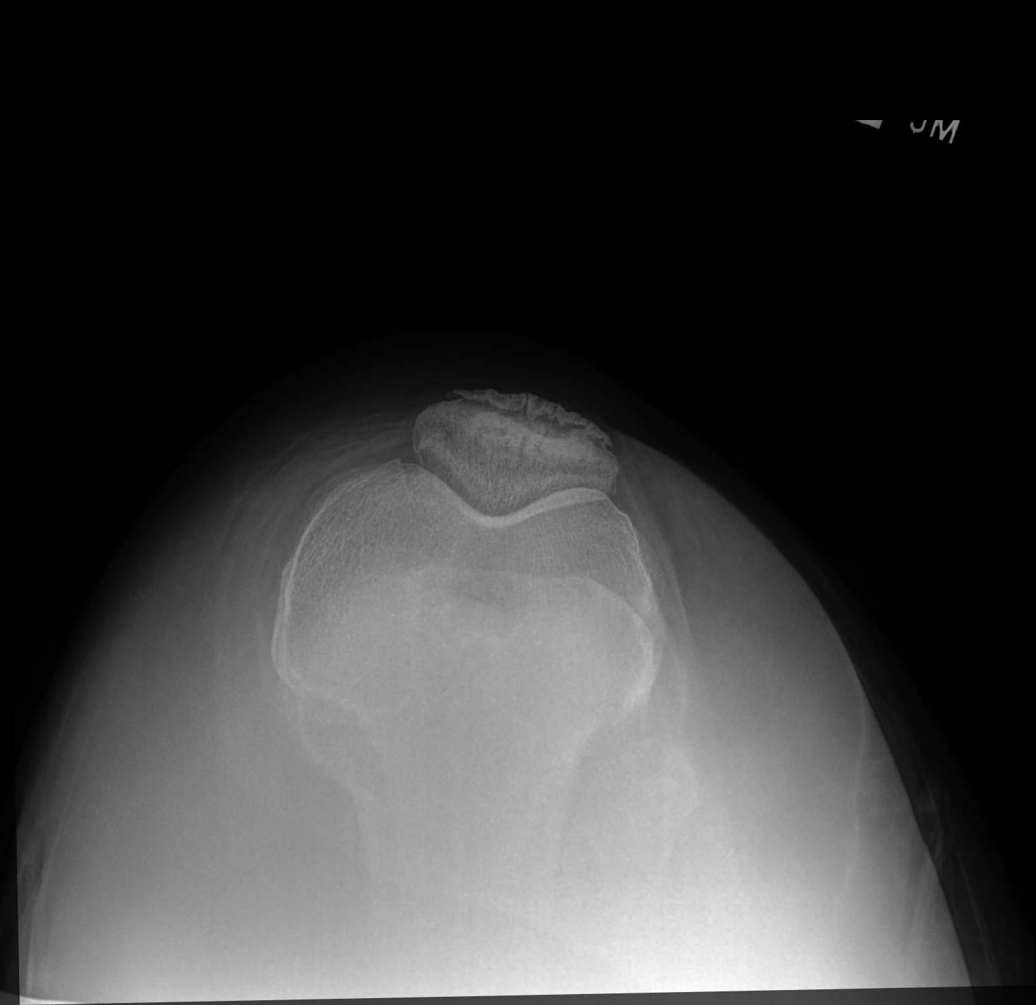

[4 of 4 positions shown; findings below may reference images not displayed]

FINDINGS: No acute fracture or dislocation. No aggressive osseous lesion.
Normal alignment. Tiny medial femorotibial compartment marginal
osteophytes.

Soft tissue are unremarkable. No radiopaque foreign body or soft
tissue emphysema.
IMPRESSION: No acute osseous injury of the left knee.

## 2023-02-02 ENCOUNTER — Other Ambulatory Visit (HOSPITAL_BASED_OUTPATIENT_CLINIC_OR_DEPARTMENT_OTHER): Payer: Self-pay

## 2023-02-08 ENCOUNTER — Other Ambulatory Visit (HOSPITAL_BASED_OUTPATIENT_CLINIC_OR_DEPARTMENT_OTHER): Payer: Self-pay

## 2023-02-08 ENCOUNTER — Other Ambulatory Visit: Payer: Self-pay | Admitting: Nurse Practitioner

## 2023-02-08 DIAGNOSIS — Z8709 Personal history of other diseases of the respiratory system: Secondary | ICD-10-CM

## 2023-04-19 ENCOUNTER — Other Ambulatory Visit: Payer: Self-pay

## 2023-04-19 ENCOUNTER — Other Ambulatory Visit: Payer: Self-pay | Admitting: Physician Assistant

## 2023-04-19 DIAGNOSIS — R1010 Upper abdominal pain, unspecified: Secondary | ICD-10-CM

## 2023-04-22 ENCOUNTER — Other Ambulatory Visit (HOSPITAL_BASED_OUTPATIENT_CLINIC_OR_DEPARTMENT_OTHER): Payer: Self-pay

## 2023-04-27 ENCOUNTER — Other Ambulatory Visit (HOSPITAL_BASED_OUTPATIENT_CLINIC_OR_DEPARTMENT_OTHER): Payer: Self-pay

## 2023-05-02 ENCOUNTER — Other Ambulatory Visit (HOSPITAL_BASED_OUTPATIENT_CLINIC_OR_DEPARTMENT_OTHER): Payer: Self-pay

## 2023-05-02 ENCOUNTER — Encounter (HOSPITAL_BASED_OUTPATIENT_CLINIC_OR_DEPARTMENT_OTHER): Payer: Self-pay

## 2023-05-12 ENCOUNTER — Other Ambulatory Visit (HOSPITAL_BASED_OUTPATIENT_CLINIC_OR_DEPARTMENT_OTHER): Payer: Self-pay

## 2023-05-12 MED ORDER — ESOMEPRAZOLE MAGNESIUM 40 MG PO CPDR
40.0000 mg | DELAYED_RELEASE_CAPSULE | Freq: Every day | ORAL | 1 refills | Status: DC
Start: 2023-05-12 — End: 2024-01-06
  Filled 2023-05-12: qty 90, 90d supply, fill #0
  Filled 2023-11-05 – 2023-12-13 (×2): qty 90, 90d supply, fill #1

## 2023-05-12 MED ORDER — DOXYCYCLINE HYCLATE 100 MG PO CAPS
100.0000 mg | ORAL_CAPSULE | Freq: Two times a day (BID) | ORAL | 0 refills | Status: AC
  Filled 2023-05-12: qty 14, 7d supply, fill #0

## 2023-05-13 ENCOUNTER — Other Ambulatory Visit (HOSPITAL_BASED_OUTPATIENT_CLINIC_OR_DEPARTMENT_OTHER): Payer: Self-pay

## 2023-05-13 MED ORDER — PSEUDOEPH-BROMPHEN-DM 30-2-10 MG/5ML PO SYRP
10.0000 mL | ORAL_SOLUTION | Freq: Four times a day (QID) | ORAL | 0 refills | Status: AC | PRN
  Filled 2023-05-13: qty 280, 7d supply, fill #0

## 2023-05-14 ENCOUNTER — Other Ambulatory Visit (HOSPITAL_BASED_OUTPATIENT_CLINIC_OR_DEPARTMENT_OTHER): Payer: Self-pay

## 2023-05-14 ENCOUNTER — Other Ambulatory Visit: Payer: Self-pay | Admitting: Nurse Practitioner

## 2023-05-14 DIAGNOSIS — Z8709 Personal history of other diseases of the respiratory system: Secondary | ICD-10-CM

## 2023-06-07 ENCOUNTER — Other Ambulatory Visit (HOSPITAL_BASED_OUTPATIENT_CLINIC_OR_DEPARTMENT_OTHER): Payer: Self-pay

## 2023-06-07 MED ORDER — WEGOVY 0.5 MG/0.5ML ~~LOC~~ SOAJ: 0.5000 mg | SUBCUTANEOUS | 0 refills | Status: DC

## 2023-06-07 MED ORDER — WEGOVY 0.25 MG/0.5ML ~~LOC~~ SOAJ
0.2500 mg | SUBCUTANEOUS | 0 refills | Status: DC
  Filled 2023-06-07: qty 2, 28d supply, fill #0

## 2023-06-08 ENCOUNTER — Other Ambulatory Visit (HOSPITAL_BASED_OUTPATIENT_CLINIC_OR_DEPARTMENT_OTHER): Payer: Self-pay

## 2023-06-15 ENCOUNTER — Other Ambulatory Visit (HOSPITAL_BASED_OUTPATIENT_CLINIC_OR_DEPARTMENT_OTHER): Payer: Self-pay

## 2023-06-22 ENCOUNTER — Other Ambulatory Visit (HOSPITAL_BASED_OUTPATIENT_CLINIC_OR_DEPARTMENT_OTHER): Payer: Self-pay

## 2023-06-24 ENCOUNTER — Other Ambulatory Visit (HOSPITAL_BASED_OUTPATIENT_CLINIC_OR_DEPARTMENT_OTHER): Payer: Self-pay

## 2023-06-24 MED ORDER — ZEPBOUND 2.5 MG/0.5ML ~~LOC~~ SOAJ
2.5000 mg | SUBCUTANEOUS | 0 refills | Status: DC
Start: 2023-06-24 — End: 2023-11-03
  Filled 2023-06-24 – 2023-06-25 (×2): qty 2, 28d supply, fill #0

## 2023-06-25 ENCOUNTER — Other Ambulatory Visit (HOSPITAL_BASED_OUTPATIENT_CLINIC_OR_DEPARTMENT_OTHER): Payer: Self-pay

## 2023-07-13 IMAGING — US US ABDOMEN LIMITED
1 series · 15 of 25 positions shown · non-contrast
Comparison: Abdominal ultrasound 09/06/2007

CLINICAL DATA: Elevated LFTs

EXAM:
ULTRASOUND ABDOMEN LIMITED RIGHT UPPER QUADRANT

[Series 1: us abdomen limited ruq mc & wl · 15 of 35 slices shown]
[im 1/35]
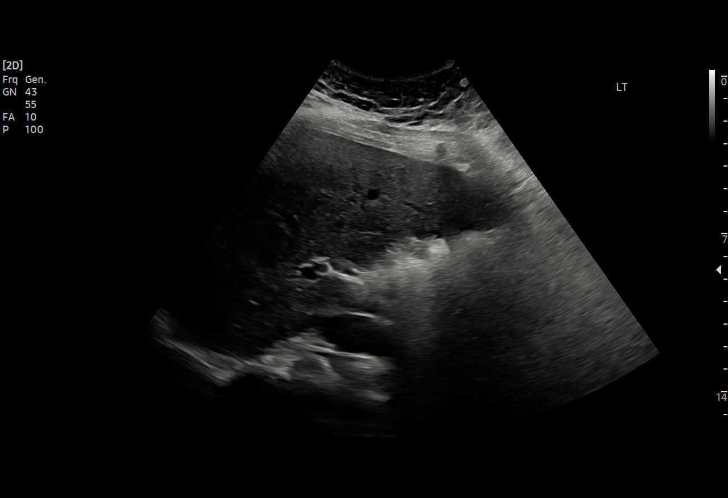
[im 3/35]
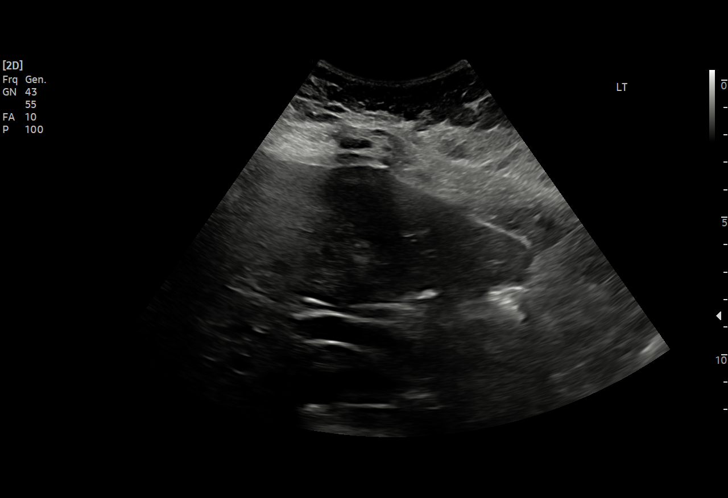
[im 6/35]
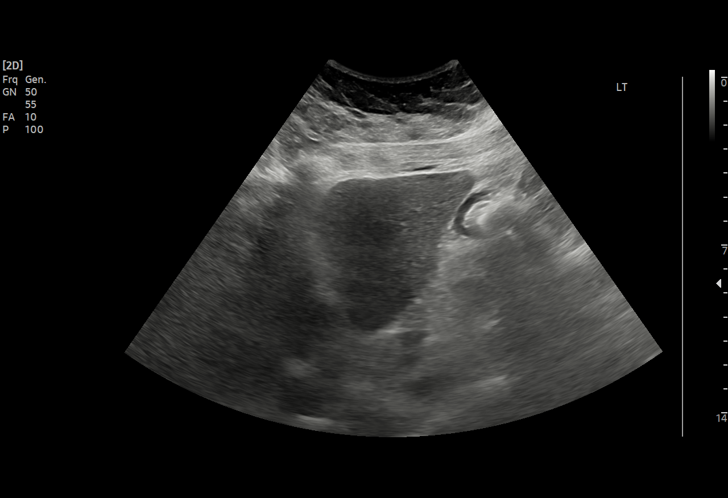
[im 8/35]
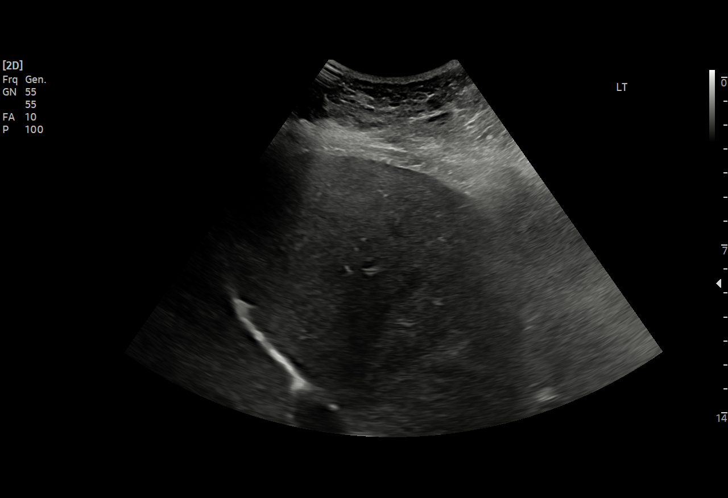
[im 10/35]
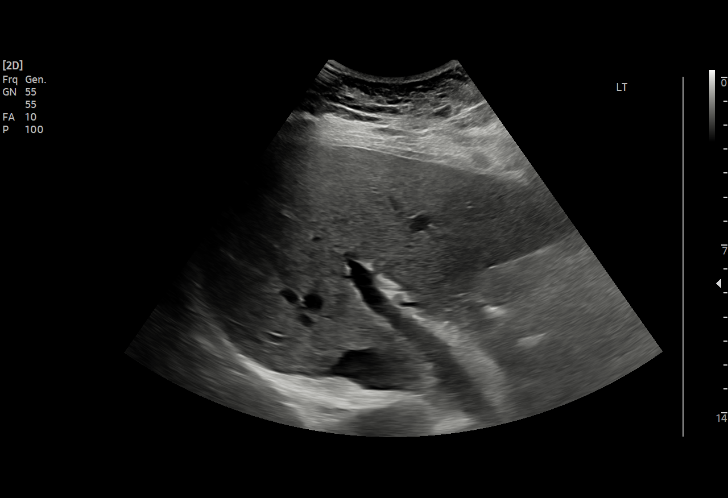
[im 13/35]
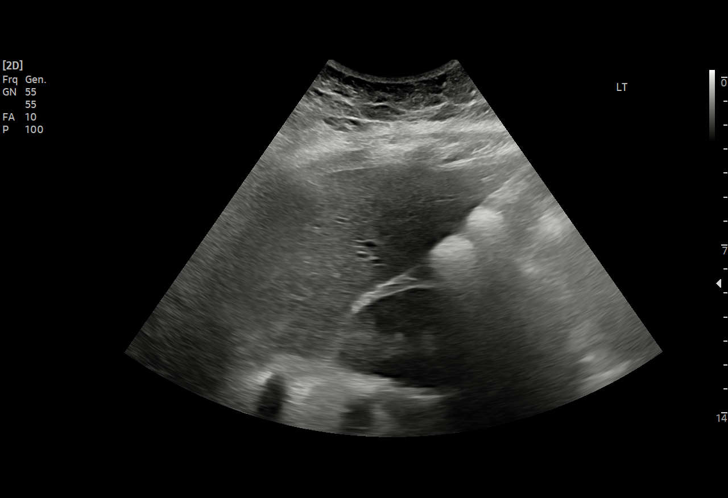
[im 15/35]
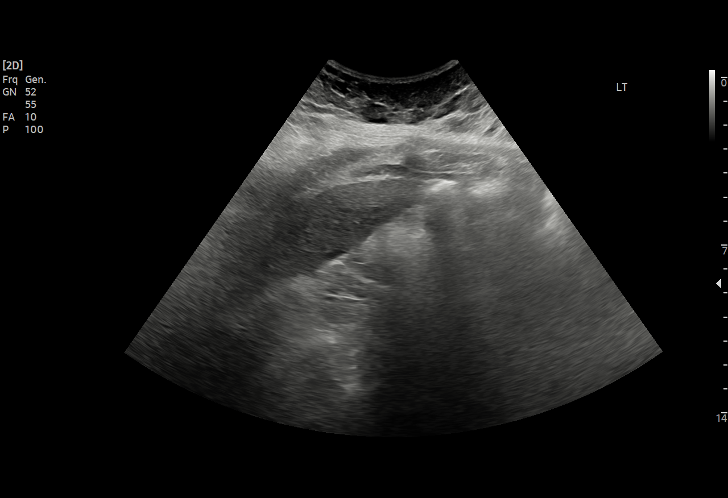
[im 18/35]
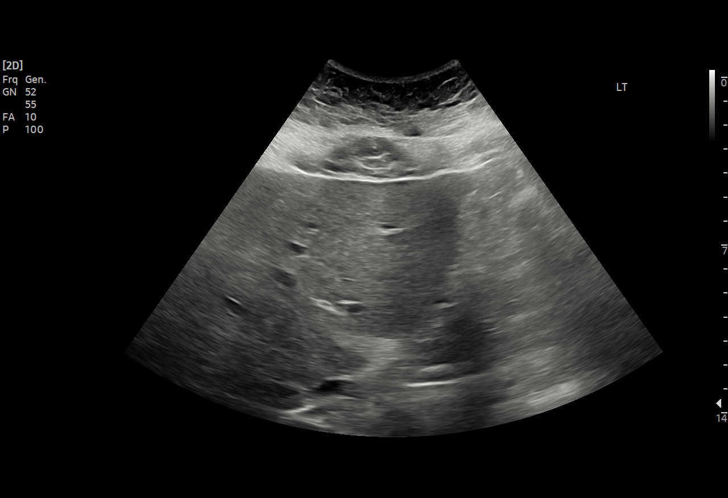
[im 20/35]
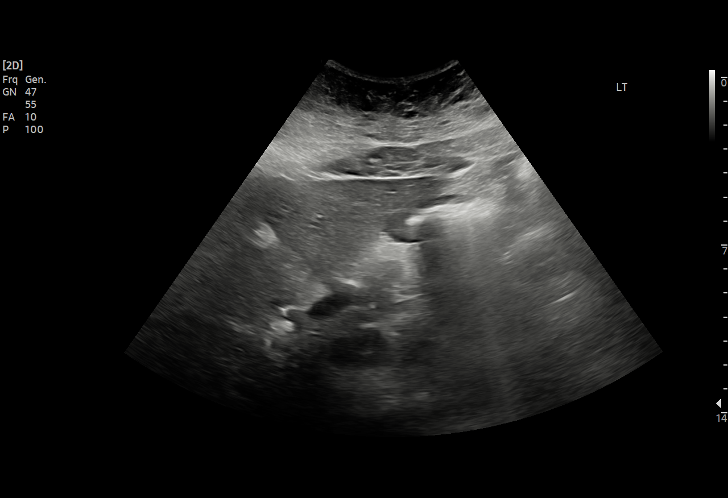
[im 22/35]
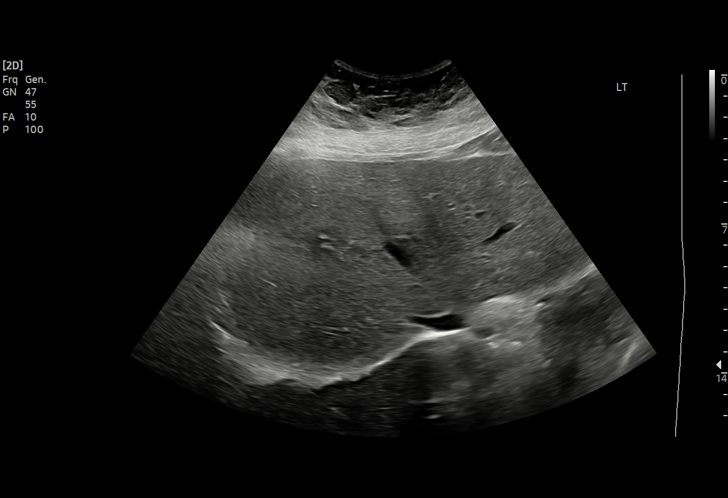
[im 25/35]
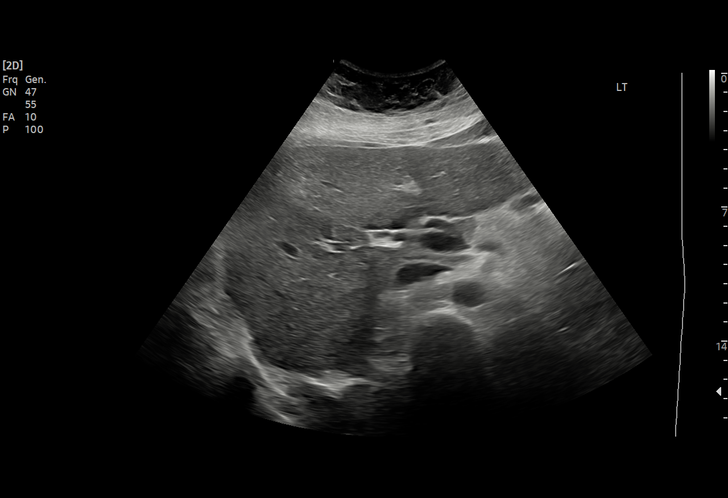
[im 27/35]
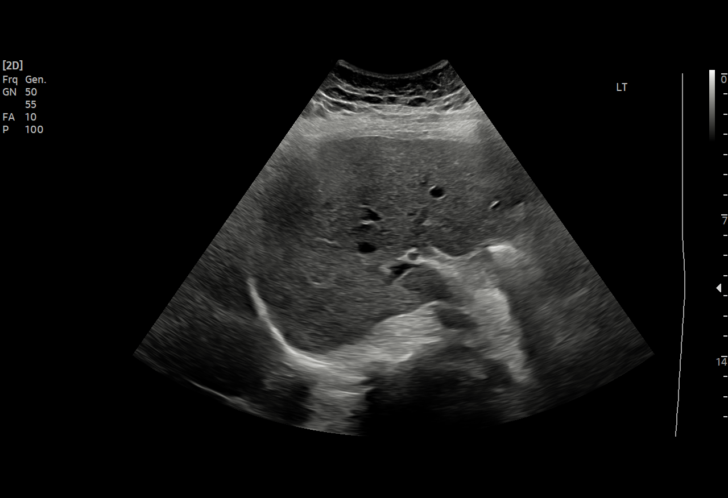
[im 29/35]
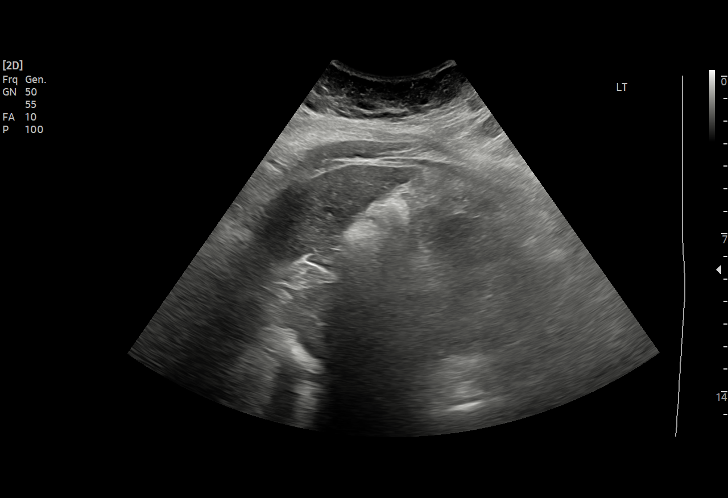
[im 32/35]
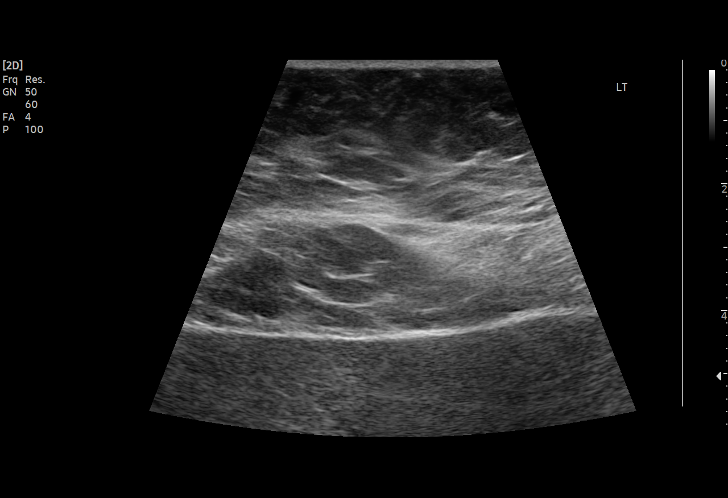
[im 35/35]
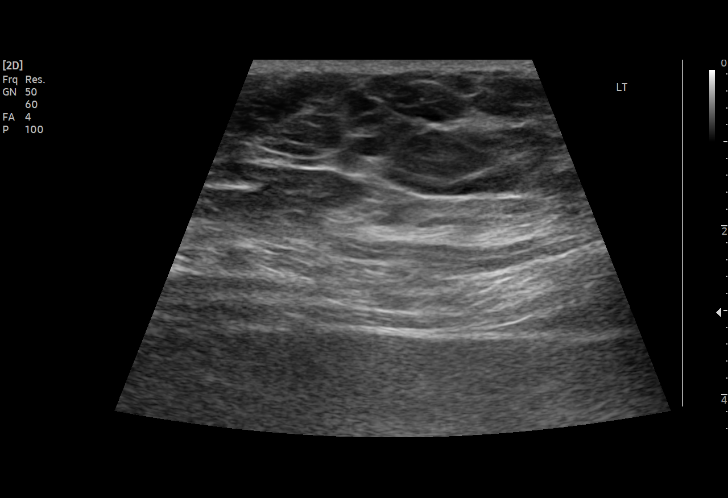

[15 of 25 positions shown; findings below may reference images not displayed]

FINDINGS: Gallbladder:

Surgically absent

Common bile duct:

Diameter: 5 mm

Liver:

No focal lesion identified. Within normal limits in parenchymal
echogenicity. Portal vein is patent on color Doppler imaging with
normal direction of blood flow towards the liver.

Other: None.
IMPRESSION: No acute process identified.

## 2023-07-25 ENCOUNTER — Other Ambulatory Visit (HOSPITAL_BASED_OUTPATIENT_CLINIC_OR_DEPARTMENT_OTHER): Payer: Self-pay

## 2023-07-25 MED ORDER — PROGESTERONE MICRONIZED 100 MG PO CAPS
100.0000 mg | ORAL_CAPSULE | Freq: Every day | ORAL | 1 refills | Status: AC
Start: 2023-07-25 — End: ?
  Filled 2023-07-25 (×2): qty 60, 30d supply, fill #0

## 2023-08-01 ENCOUNTER — Other Ambulatory Visit: Payer: Self-pay

## 2023-08-01 ENCOUNTER — Emergency Department (HOSPITAL_COMMUNITY)

## 2023-08-01 ENCOUNTER — Emergency Department (HOSPITAL_COMMUNITY)
Admission: EM | Admit: 2023-08-01 | Discharge: 2023-08-01 | Disposition: A | Discharge status: Home / Self Care | Attending: Emergency Medicine | Admitting: Emergency Medicine

## 2023-08-01 ENCOUNTER — Encounter (HOSPITAL_COMMUNITY): Payer: Self-pay | Admitting: Emergency Medicine

## 2023-08-01 DIAGNOSIS — I509 Heart failure, unspecified: Secondary | ICD-10-CM | POA: Diagnosis not present

## 2023-08-01 DIAGNOSIS — R002 Palpitations: Secondary | ICD-10-CM

## 2023-08-01 DIAGNOSIS — I491 Atrial premature depolarization: Secondary | ICD-10-CM | POA: Diagnosis not present

## 2023-08-01 LAB — URINALYSIS, ROUTINE W REFLEX MICROSCOPIC
Bilirubin Urine: NEGATIVE
Glucose, UA: NEGATIVE mg/dL
Ketones, ur: NEGATIVE mg/dL
Nitrite: NEGATIVE
Protein, ur: NEGATIVE mg/dL
Specific Gravity, Urine: 1.011 (ref 1.005–1.030)
pH: 6 (ref 5.0–8.0)

## 2023-08-01 LAB — PREGNANCY, URINE: Preg Test, Ur: NEGATIVE

## 2023-08-01 LAB — CBC
HCT: 46.2 % — ABNORMAL HIGH (ref 36.0–46.0)
Hemoglobin: 14.2 g/dL (ref 12.0–15.0)
MCH: 28.4 pg (ref 26.0–34.0)
MCHC: 30.7 g/dL (ref 30.0–36.0)
MCV: 92.4 fL (ref 80.0–100.0)
Platelets: 312 10*3/uL (ref 150–400)
RBC: 5 MIL/uL (ref 3.87–5.11)
RDW: 13.8 % (ref 11.5–15.5)
WBC: 11 10*3/uL — ABNORMAL HIGH (ref 4.0–10.5)
nRBC: 0 % (ref 0.0–0.2)

## 2023-08-01 LAB — CBG MONITORING, ED: Glucose-Capillary: 88 mg/dL (ref 70–99)

## 2023-08-01 LAB — BRAIN NATRIURETIC PEPTIDE: B Natriuretic Peptide: 67.4 pg/mL (ref 0.0–100.0)

## 2023-08-01 LAB — BASIC METABOLIC PANEL WITH GFR
Anion gap: 13 (ref 5–15)
BUN: 13 mg/dL (ref 6–20)
CO2: 22 mmol/L (ref 22–32)
Calcium: 8.5 mg/dL — ABNORMAL LOW (ref 8.9–10.3)
Chloride: 104 mmol/L (ref 98–111)
Creatinine, Ser: 0.83 mg/dL (ref 0.44–1.00)
GFR, Estimated: >60 mL/min (ref >60)
Glucose, Bld: 97 mg/dL (ref 70–99)
Potassium: 3.7 mmol/L (ref 3.5–5.1)
Sodium: 139 mmol/L (ref 135–145)

## 2023-08-01 NOTE — ED Triage Notes (Signed)
 [facility]. Pt reports symptoms started yesterday and worsened around 0400 this morning. Sinus arrhythmia w/ EMS. Pt alert and oriented on arrival. Denies any CP or SHOB.   EMS VS: BP 130/77 HR 85 SR 100% RA

## 2023-08-01 NOTE — ED Notes (Signed)
 Preg test changed to urine preg pt difficult stick by marcus rn

## 2023-08-01 NOTE — ED Notes (Signed)
 Patient did not want to be discharged until additional questions were answered by Dr. Synetta Eves, Ray, notified.

## 2023-08-01 NOTE — ED Provider Notes (Signed)
 [place] EMERGENCY DEPARTMENT AT [facility] Provider Note   CSN: [id number] Arrival date & time: 08/01/23  5284     History  Chief Complaint  Patient presents with   Palpitations    Kristina Khan is a 43 y.o. female.  HPI 43 year old female presents today complaining of palpitations.  She noticed feeling somewhat dizzy yesterday.  She has a history of vertigo.  She took meclizine.  She then continued to feel somewhat weak and felt these irregular heartbeats or palpitations.  She denies any chest pain or dyspnea.  She is not any fever or chills.  She does have a history of CHF and was on Lasix  in the past.  She reports they took her off of it after about a year and she currently is not on any cardiac, vasoactive, or diuretics.  She is only taking a multivitamin and omeprazole.  She denies any fever, chills, cough, history of DVT or PE.  She has gained a few pounds.  She is not sure whether it is contributing to any leg swelling.     Home Medications Prior to Admission medications   Medication Sig Start Date End Date Taking? Authorizing Provider  albuterol  (VENTOLIN  HFA) 108 (90 Base) MCG/ACT inhaler Inhale into the lungs every 6 (six) hours as needed for wheezing or shortness of breath.    [provider]  albuterol  (VENTOLIN  HFA) 108 (90 Base) MCG/ACT inhaler Inhale 2 puffs into the lungs every 6 (six) hours as needed for wheezing or shortness of breath. 07/14/22   Mardene Shake, FNP  benzonatate  (TESSALON ) 100 MG capsule Take 1 capsule (100 mg total) by mouth 3 (three) times daily as needed. 07/14/22   Mardene Shake, FNP  BIOTIN PO Take 1 tablet by mouth 3 (three) times a week.    [provider]  brompheniramine-pseudoephedrine-DM 30-2-10 MG/5ML syrup Take 10 mLs by mouth every 6 (six) hours as needed for cough. 05/13/23     Calcium Citrate-Vitamin D  (CALCIUM CITRATE +D PO) Take 1 each by mouth 2 (two) times a week.    [provider]   esomeprazole  (NEXIUM ) 40 MG capsule Take 1 capsule (40 mg total) by mouth 2 (two) times daily before a meal. 05/13/22   Edmonia Gottron, PA-C  esomeprazole  (NEXIUM ) 40 MG capsule Take 1 capsule (40 mg total) by mouth daily. 05/12/23     famotidine  (PEPCID ) 20 MG tablet Take 1 tablet (20 mg total) by mouth 2 (two) times daily. 02/16/22 02/16/23  Edmonia Gottron, PA-C  fluticasone  (FLONASE ) 50 MCG/ACT nasal spray Administer 2 sprays into each nostril nightly. 10/05/22     furosemide  (LASIX ) 20 MG tablet Take 1 tablet (20 mg total) by mouth once a week. 08/13/22   Tobb, Kardie, DO  gabapentin (NEURONTIN) 300 MG capsule Take 300 mg by mouth at bedtime as needed (pain). 06/21/19   [provider]  hyoscyamine  (OSCIMIN ) 0.125 MG tablet Take 1 tablet (0.125 mg total) by mouth every 6 (six) hours as needed for cramping. 08/21/21   Santina Cull R, PA-C  ipratropium (ATROVENT ) 0.03 % nasal spray Place 2 sprays into both nostrils every 12 (twelve) hours. 07/14/22   Mardene Shake, FNP  Multiple Vitamins-Minerals (BARIATRIC FUSION PO) Take 1 tablet by mouth in the morning and at bedtime.    [provider]  olmesartan  (BENICAR ) 20 MG tablet Take 0.5 tablets (10 mg total) by mouth daily. 11/19/21   Tobb, Kardie, DO  potassium chloride  (KLOR-CON ) 10 MEQ tablet  Take 1 tablet by mouth weekly with furosemide  dose. 08/13/22   Tobb, Kardie, DO  predniSONE  (DELTASONE ) 10 MG tablet Take 4 tablets (40mg ) on days 1-4, then 3 tablets (30mg ) on days 5-8, then 2 tablets (20mg ) on days 9-11, then 1 tablet daily for days 12-14. Take with food. 06/23/22   Mardene Shake, FNP  progesterone  (PROMETRIUM ) 100 MG capsule Take 1-2 capsules (100-200 mg total) by mouth at bedtime. 07/25/23     Semaglutide -Weight Management (WEGOVY ) 1.7 MG/0.75ML SOAJ Inject 1.7 mg into the skin once a week. 02/16/22   Tobb, Kardie, DO  Semaglutide -Weight Management 2.4 MG/0.75ML SOAJ Inject 2.4 mg into the skin once a week. Patient not taking:  Reported on 09/21/2021 07/15/21   Tobb, Kardie, DO  tirzepatide  (ZEPBOUND ) 2.5 MG/0.5ML Pen Inject 2.5 mg into the skin once a week. 06/24/23     trimethoprim -polymyxin b  (POLYTRIM ) ophthalmic solution Instill 1-2 drops into affected eye four times daily x 5 days. 02/23/22   Farris Hong, PA-C  Vitamin D , Ergocalciferol , (DRISDOL) 1.25 MG (50000 UNIT) CAPS capsule Take 50,000 Units by mouth every Tuesday.    [provider]      Allergies    Shellfish allergy and Penicillins    Review of Systems   Review of Systems  Physical Exam Updated Vital Signs BP 136/74 (BP Location: Right Arm)   Pulse 74   Temp 97.6 F (36.4 C) (Oral)   Resp 16   SpO2 100%  Physical Exam Vitals reviewed.  Constitutional:      Appearance: Normal appearance. She is obese.  HENT:     Head: Normocephalic.     Right Ear: External ear normal.     Left Ear: External ear normal.     Nose: Nose normal.     Mouth/Throat:     Pharynx: Oropharynx is clear.  Eyes:     Extraocular Movements: Extraocular movements intact.     Pupils: Pupils are equal, round, and reactive to light.  Cardiovascular:     Rate and Rhythm: Normal rate and regular rhythm.  Pulmonary:     Effort: Pulmonary effort is normal.     Breath sounds: Normal breath sounds.  Abdominal:     General: Bowel sounds are normal.     Palpations: Abdomen is soft.  Musculoskeletal:     Cervical back: Normal range of motion.     Comments: Trace edema bilaterally  Skin:    General: Skin is warm and dry.     Capillary Refill: Capillary refill takes less than 2 seconds.  Neurological:     General: No focal deficit present.     Mental Status: She is alert.  Psychiatric:        Mood and Affect: Mood normal.     ED Results / Procedures / Treatments   Labs (all labs ordered are listed, but only abnormal results are displayed) Labs Reviewed  BASIC METABOLIC PANEL WITH GFR - Abnormal; Notable for the following components:      Result Value    Calcium 8.5 (*)    All other components within normal limits  CBC - Abnormal; Notable for the following components:   WBC 11.0 (*)    HCT 46.2 (*)    All other components within normal limits  URINALYSIS, ROUTINE W REFLEX MICROSCOPIC - Abnormal; Notable for the following components:   APPearance CLOUDY (*)    Hgb urine dipstick LARGE (*)    Leukocytes,Ua LARGE (*)    Bacteria, UA MANY (*)  All other components within normal limits  BRAIN NATRIURETIC PEPTIDE  PREGNANCY, URINE  CBG MONITORING, ED    EKG EKG Interpretation Date/Time:  Monday August 01 2023 08:51:15 EDT Ventricular Rate:  81 PR Interval:  128 QRS Duration:  66 QT Interval:  358 QTC Calculation: 415 R Axis:   63  Text Interpretation: Sinus rhythm with Premature atrial complexes with Abberant conduction Otherwise normal ECG When compared with ECG of 26-Oct-2007 14:09, PREVIOUS ECG IS PRESENT Confirmed by Auston Blush [phone]) on 08/01/2023 8:54:46 AM  Radiology No results found.  Procedures Procedures    Medications Ordered in ED Medications - No data to display  ED Course/ Medical Decision Making/ A&P                                 Medical Decision Making Amount and/or Complexity of Data Reviewed Labs: ordered. Radiology: ordered.   Patient with palpitations DDX-v tach, pvc, pac, cad, electrolyte abnormality, tacycardia, substance induced-caffeine medications. Patient with pac on EKG Electrolytes normal except mild hypocalcemia Discussed return precautions and need for follow up       Final Clinical Impression(s) / ED Diagnoses Final diagnoses:  Palpitations  Premature atrial contractions  Hypocalcemia    Rx / DC Orders ED Discharge Orders     None         Auston Blush, MD 08/01/23 1150

## 2023-08-01 NOTE — ED Notes (Addendum)
 Note edited/deleted

## 2023-08-01 NOTE — Discharge Instructions (Addendum)
 Please take otc calcium and have calcium rechecked Call your doctor for follow up this week Return if new or worsening symptoms especially pain or shortness of breath.

## 2023-08-02 NOTE — Progress Notes (Unsigned)
 Cardiology Office Note   Date:  08/03/2023  ID:  Kristina, Khan 1981-04-05, MRN [id number] PCP:  Jacqulyne Maxim, MD [facility] Cardiologist: Jerryl Morin, DO  Reason for visit: Hospital follow-up  History of Present Illness    Kristina Khan is a 43 y.o. female with a hx of Hashimoto's thyroiditis with left thyroid  nodule,.  She was referred to Dr. Emmette Harms for shortness of breath.  Patient was started on Lasix  once weekly.  Echo showed normal EF.  She followed up with our pharmacy team for weight loss and blood pressure control.  She was last seen by Christopher Pavero, PharmD.  Patient related high blood pressure to stress.  She works for [employer].  With borderline hypotension, olmesartan  was reduced from 20 to 10 mg daily.  Patient was hesitant to increase Wegovy , worried about possible side effects.  Kept on Wegovy  1.7 mg once a week.  Followed up with PCP and Wegovy  dose titrated.  She went to [facility] ED on April 21 with palpitations and dizziness.  She was no longer on Lasix .  Blood pressure was 136/74.    EKG showed normal sinus rhythm with PACs.  Today, patient states the day prior to her going to the ED, she felt dizziness and off-balance.  She has a history of vertigo and takes over-the-counter meclizine.  She states the next morning she woke up with palpitations which she has not had in the past.  She does not recall that she did anything differently, ate or drink something unusual that prompted her symptoms.  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  She checked patient and states heart rate was all over the place and went up to the 170s.  She states since leaving the ED, she has had some palpitations when her heart rate feels faster than normal.  Patient denies chest pain, shortness of breath and leg swelling.  She states she is off most medications now including Benicar  and Lasix .  Her only regular medications now are Nexium  and  multivitamins.   Objective / Physical Exam   Vital signs:  BP 118/88 (BP Location: Right Arm, Patient Position: Sitting, Cuff Size: Normal)   Pulse 82   Ht 4\' 11"  (1.499 m)   Wt 270 lb (122.5 kg)   SpO2 99%   BMI 54.53 kg/m     GEN: No acute distress NECK: No carotid bruits CARDIAC: RRR, no murmurs RESPIRATORY:  Clear to auscultation without rales, wheezing or rhonchi  EXTREMITIES: No edema  Assessment and Plan   Palpitations PACs -EKG on April 21 shows normal sinus rhythm with PACs, heart rate 81. -RRR today, do not significant ectopy on exam. -Recommend 2 weeks ZIO patch to assess for arrhythmias. -Follow-up in 8 weeks.  If his Zio patch results are unremarkable and symptoms resolve, she can follow-up with us  as needed. -Discussed healthy diet, hydration, good sleep, stress management etc. to mitigate palpitations.  Hypertension, controlled -Blood pressure controlled off medications.  Previously on Benicar  10 mg daily. -Goal BP is <130/80.  Recommend DASH diet (high in vegetables, fruits, low-fat dairy products, whole grains, poultry, fish, and nuts and low in sweets, sugar-sweetened beverages, and red meats), salt restriction and increase physical activity.  Obesity -Even a 5-10% weight loss can have cardiovascular benefits.   -Recommend moderate intensity activity for 30 minutes 5 days/week and the DASH diet. -She states she cannot afford weight loss medications  Disposition - Follow-up in 8 weeks to  follow-up on Zio patch results.   Signed, Conan December, PA-C  08/03/2023 [facility]

## 2023-08-03 ENCOUNTER — Ambulatory Visit: Attending: Physician Assistant

## 2023-08-03 ENCOUNTER — Encounter: Payer: Self-pay | Admitting: Physician Assistant

## 2023-08-03 ENCOUNTER — Other Ambulatory Visit: Payer: Self-pay | Admitting: Physician Assistant

## 2023-08-03 ENCOUNTER — Ambulatory Visit: Attending: Physician Assistant | Admitting: Physician Assistant

## 2023-08-03 VITALS — BP 118/88 | HR 82 | Ht 59.0 in | Wt 270.0 lb

## 2023-08-03 DIAGNOSIS — R002 Palpitations: Secondary | ICD-10-CM

## 2023-08-03 DIAGNOSIS — I491 Atrial premature depolarization: Secondary | ICD-10-CM

## 2023-08-03 DIAGNOSIS — I10 Essential (primary) hypertension: Secondary | ICD-10-CM

## 2023-08-03 DIAGNOSIS — R42 Dizziness and giddiness: Secondary | ICD-10-CM

## 2023-08-03 NOTE — Patient Instructions (Addendum)
 Medication Instructions:  No medication changes were made during today's visit.  *If you need a refill on your cardiac medications before your next appointment, please call your pharmacy*   Lab Work: No labs were ordered during today's visit.  If you have labs (blood work) drawn today and your tests are completely normal, you will receive your results only by: MyChart Message (if you have MyChart) OR A paper copy in the mail If you have any lab test that is abnormal or we need to change your treatment, we will call you to review the results.   Testing/Procedures: ZIO XT- Long Term Monitor Instructions   Your physician has requested you wear your ZIO patch monitor 14 days.   This is a single patch monitor.  [employer] supplies one patch monitor per enrollment.  Additional stickers are not available.   Please do not apply patch if you will be having a Nuclear Stress Test, Echocardiogram, Cardiac CT, MRI, or Chest Xray during the time frame you would be wearing the monitor. The patch cannot be worn during these tests.  You cannot remove and re-apply the ZIO XT patch monitor.   Your ZIO patch monitor will be sent USPS Priority mail from [facility] directly to your home address. The monitor may also be mailed to a PO BOX if home delivery is not available.   It may take 3-5 days to receive your monitor after you have been enrolled.   Once you have received you monitor, please review enclosed instructions.  Your monitor has already been registered assigning a specific monitor serial # to you.    Applying the monitor   Shave hair from upper left chest.   Hold abrader disc by orange tab.  Rub abrader in 40 strokes over left upper chest as indicated in your monitor instructions.   Clean area with 4 enclosed alcohol pads .  Use all pads to assure are is cleaned thoroughly.  Let dry.   Apply patch as indicated in monitor instructions.  Patch will be place under collarbone on left side  of chest with arrow pointing upward.   Rub patch adhesive wings for 2 minutes.Remove white label marked "1".  Remove white label marked "2".  Rub patch adhesive wings for 2 additional minutes.   While looking in a mirror, press and release button in center of patch.  A small green light will flash 3-4 times .  This will be your only indicator the monitor has been turned on.     Do not shower for the first 24 hours.  You may shower after the first 24 hours.   Press button if you feel a symptom. You will hear a small click.  Record Date, Time and Symptom in the Patient Log Book.   When you are ready to remove patch, follow instructions on last 2 pages of Patient Log Book.  Stick patch monitor onto last page of Patient Log Book.   Place Patient Log Book in [place] box.  Use locking tab on box and tape box closed securely.  The [employer] and [employer] has [employer] on it.  Please place in mailbox as soon as possible.  Your physician should have your test results approximately 7 days after the monitor has been mailed back to [facility]   Call [facility] Customer Care at [phone] if you have questions regarding your ZIO XT patch monitor.  Call them immediately if you see an orange light blinking on your monitor.  If your monitor falls off in less than 4 days contact our Monitor department at [phone].  If your monitor becomes loose or falls off after 4 days call [employer] at [phone] for suggestions on securing your monitor.     Follow-Up: At [facility], you and your health needs are our priority.  As part of our continuing mission to provide you with exceptional heart care, we have created designated Provider Care Teams.  These Care Teams include your primary Cardiologist (physician) and Advanced Practice Providers (APPs -  Physician Assistants and Nurse Practitioners) who all work together to provide you with the care you need, when you need it.  We recommend  signing up for the patient portal called "MyChart".  Sign up information is provided on this After Visit Summary.  MyChart is used to connect with patients for Virtual Visits (Telemedicine).  Patients are able to view lab/test results, encounter notes, upcoming appointments, etc.  Non-urgent messages can be sent to your provider as well.   To learn more about what you can do with MyChart, go to [email or web address].    Your next appointment:   8 week(s)  Provider:   Jerryl Morin, DO or available APP    Other Instructions [facility]  [address] [place], [place]  [address] OPENING APRIL 28,2025       1st Floor: - Lobby - Registration  - Pharmacy  - Lab - Cafe   2nd Floor: - PV Lab - Diagnostic Testing (echo, CT, nuclear med)   3rd Floor: - Vacant   4th Floor: - TCTS (cardiothoracic surgery) - AFib Clinic - Structural Heart Clinic - Vascular Surgery  - Vascular Ultrasound   5th Floor: - HeartCare Cardiology (general and EP) - Clinical Pharmacy for coumadin, hypertension, lipid, weight-loss medications, and med management appointments      Valet parking services will be available as well.

## 2023-08-03 NOTE — Progress Notes (Unsigned)
 Enrolled for [employer] to mail a ZIO XT long term holter monitor to the patients address on file.   Dr. Harriet Masson to read.

## 2023-09-06 ENCOUNTER — Ambulatory Visit: Payer: Self-pay | Admitting: Physician Assistant

## 2023-09-06 DIAGNOSIS — R42 Dizziness and giddiness: Secondary | ICD-10-CM

## 2023-09-06 DIAGNOSIS — R002 Palpitations: Secondary | ICD-10-CM

## 2023-09-06 DIAGNOSIS — I491 Atrial premature depolarization: Secondary | ICD-10-CM | POA: Diagnosis not present

## 2023-09-08 ENCOUNTER — Ambulatory Visit: Admitting: Emergency Medicine

## 2023-09-21 ENCOUNTER — Encounter: Payer: Self-pay | Admitting: Nurse Practitioner

## 2023-09-21 ENCOUNTER — Other Ambulatory Visit (HOSPITAL_BASED_OUTPATIENT_CLINIC_OR_DEPARTMENT_OTHER): Payer: Self-pay

## 2023-09-21 ENCOUNTER — Ambulatory Visit: Attending: Cardiovascular Disease | Admitting: Nurse Practitioner

## 2023-09-21 VITALS — BP 128/72 | HR 76 | Ht 59.0 in | Wt 270.0 lb

## 2023-09-21 DIAGNOSIS — I471 Supraventricular tachycardia, unspecified: Secondary | ICD-10-CM | POA: Diagnosis not present

## 2023-09-21 DIAGNOSIS — I491 Atrial premature depolarization: Secondary | ICD-10-CM

## 2023-09-21 DIAGNOSIS — I10 Essential (primary) hypertension: Secondary | ICD-10-CM | POA: Diagnosis not present

## 2023-09-21 DIAGNOSIS — R002 Palpitations: Secondary | ICD-10-CM

## 2023-09-21 MED ORDER — PROPRANOLOL HCL 10 MG PO TABS
10.0000 mg | ORAL_TABLET | Freq: Three times a day (TID) | ORAL | 3 refills | Status: AC | PRN
  Filled 2023-09-21: qty 90, 30d supply, fill #0

## 2023-09-21 NOTE — Patient Instructions (Signed)
 Medication Instructions:  Start Propranolol 10 mg three times daily as needed.  *If you need a refill on your cardiac medications before your next appointment, please call your pharmacy*  Lab Work: NONE ordered at this time of appointment    Testing/Procedures: NONE ordered at this time of appointment   Follow-Up: At [facility], you and your health needs are our priority.  As part of our continuing mission to provide you with exceptional heart care, our providers are all part of one team.  This team includes your primary Cardiologist (physician) and Advanced Practice Providers or APPs (Physician Assistants and Nurse Practitioners) who all work together to provide you with the care you need, when you need it.  Your next appointment:   6 week(s)  Provider:   Kardie Tobb, DO or Marlana Silvan, NP          We recommend signing up for the patient portal called MyChart.  Sign up information is provided on this After Visit Summary.  MyChart is used to connect with patients for Virtual Visits (Telemedicine).  Patients are able to view lab/test results, encounter notes, upcoming appointments, etc.  Non-urgent messages can be sent to your provider as well.   To learn more about what you can do with MyChart, go to [email or web address].   Other Instructions

## 2023-09-21 NOTE — Progress Notes (Signed)
 Office Visit    Patient Name: Kristina Khan Date of Encounter: 09/21/2023  Primary Care Provider:  Jacqulyne Maxim, MD Primary Cardiologist:  Jerryl Morin, DO  Chief Complaint    43 year old female with a history of palpitations, PACs, hypertension, prediabetes, Hashimoto's thyroiditis, left thyroid  nodule, and obesity who presents for follow-up related to palpitations.  Past Medical History    Past Medical History:  Diagnosis Date   GERD (gastroesophageal reflux disease)    Hashimoto's thyroiditis    Obesity    Prediabetes    Past Surgical History:  Procedure Laterality Date   BIOPSY  09/28/2021   Procedure: BIOPSY;  Surgeon: Daina Drum, MD;  Location: [facility] ENDOSCOPY;  Service: Gastroenterology;;   CHOLECYSTECTOMY  2009   ESOPHAGOGASTRODUODENOSCOPY (EGD) WITH PROPOFOL  N/A 09/28/2021   Procedure: ESOPHAGOGASTRODUODENOSCOPY (EGD) WITH PROPOFOL ;  Surgeon: Daina Drum, MD;  Location: [facility] ENDOSCOPY;  Service: Gastroenterology;  Laterality: N/A;   HIATAL HERNIA REPAIR  2017   LAPAROSCOPIC GASTRIC SLEEVE RESECTION  2017   POLYPECTOMY  09/28/2021   Procedure: POLYPECTOMY;  Surgeon: Daina Drum, MD;  Location: [facility] ENDOSCOPY;  Service: Gastroenterology;;   TONSILLECTOMY  2012    Allergies  Allergies  Allergen Reactions   Shellfish Allergy Anaphylaxis   Penicillins Rash     Labs/Other Studies Reviewed    The following studies were reviewed today:  Cardiac Studies & Procedures   ______________________________________________________________________________________________     ECHOCARDIOGRAM  ECHOCARDIOGRAM COMPLETE 04/14/2021  Narrative ECHOCARDIOGRAM REPORT    Patient Name:   Kristina Khan Date of Exam: 04/14/2021 Medical Rec #:  [id number]          Height:       59.0 in Accession #:    [id number]         Weight:       263.0 lb Date of Birth:  1980/11/22         BSA:          2.072 m Patient Age:    40 years           BP:           128/84  mmHg Patient Gender: F                  HR:           91 bpm. Exam Location:  [address]  Procedure: 2D Echo, 3D Echo, Cardiac Doppler, Color Doppler and Strain Analysis  Indications:    R06.02 Shortness of breath  History:        Patient has no prior history of Echocardiogram examinations.  Sonographer:    Verena Glaser BS, RDCS Referring Phys: [id number] KARDIE TOBB  IMPRESSIONS   1. Left ventricular ejection fraction, by estimation, is 60 to 65%. Left ventricular ejection fraction by PLAX is 61 %. The left ventricle has normal function. The left ventricle has no regional wall motion abnormalities. Left ventricular diastolic parameters were normal. The average left ventricular global longitudinal strain is -25.2 %. The global longitudinal strain is normal. 2. Right ventricular systolic function is normal. The right ventricular size is normal. There is normal pulmonary artery systolic pressure. 3. The mitral valve is normal in structure. No evidence of mitral valve regurgitation. No evidence of mitral stenosis. 4. The aortic valve is tricuspid. Aortic valve regurgitation is not visualized. No aortic stenosis is present. 5. The inferior vena cava is normal in size with greater than 50% respiratory variability, suggesting right atrial pressure  of 3 mmHg.  FINDINGS Left Ventricle: Left ventricular ejection fraction, by estimation, is 60 to 65%. Left ventricular ejection fraction by PLAX is 61 %. The left ventricle has normal function. The left ventricle has no regional wall motion abnormalities. The average left ventricular global longitudinal strain is -25.2 %. The global longitudinal strain is normal. The left ventricular internal cavity size was normal in size. There is no left ventricular hypertrophy. Left ventricular diastolic parameters were normal. Indeterminate filling pressures.  Right Ventricle: The right ventricular size is normal. No increase in right ventricular wall thickness.  Right ventricular systolic function is normal. There is normal pulmonary artery systolic pressure. The tricuspid regurgitant velocity is 1.15 m/s, and with an assumed right atrial pressure of 3 mmHg, the estimated right ventricular systolic pressure is 8.3 mmHg.  Left Atrium: Left atrial size was normal in size.  Right Atrium: Right atrial size was normal in size.  Pericardium: There is no evidence of pericardial effusion.  Mitral Valve: The mitral valve is normal in structure. No evidence of mitral valve regurgitation. No evidence of mitral valve stenosis.  Tricuspid Valve: The tricuspid valve is normal in structure. Tricuspid valve regurgitation is trivial. No evidence of tricuspid stenosis.  Aortic Valve: The aortic valve is tricuspid. Aortic valve regurgitation is not visualized. No aortic stenosis is present.  Pulmonic Valve: The pulmonic valve was normal in structure. Pulmonic valve regurgitation is not visualized. No evidence of pulmonic stenosis.  Aorta: The aortic root is normal in size and structure.  Venous: The inferior vena cava is normal in size with greater than 50% respiratory variability, suggesting right atrial pressure of 3 mmHg.  IAS/Shunts: No atrial level shunt detected by color flow Doppler.   LEFT VENTRICLE PLAX 2D LV EF:         Left            Diastology ventricular     LV e' medial:    10.90 cm/s ejection        LV E/e' medial:  9.9 fraction by     LV e' lateral:   17.80 cm/s PLAX is 61      LV E/e' lateral: 6.1 %. LVIDd:         4.00 cm         2D LVIDs:         2.70 cm         Longitudinal LV PW:         1.00 cm         Strain LV IVS:        0.90 cm         2D Strain GLS  -29.4 % LVOT diam:     2.20 cm         (A2C): LV SV:         70              2D Strain GLS  -25.0 % LV SV Index:   34              (A3C): LVOT Area:     3.80 cm        2D Strain GLS  -21.0 % (A4C): 2D Strain GLS  -25.2 % Avg:  3D Volume EF: 3D EF:        62 % LV EDV:        109 ml LV ESV:       41 ml LV SV:  68 ml  RIGHT VENTRICLE             IVC RV Basal diam:  3.20 cm     IVC diam: 1.90 cm RV S prime:     16.40 cm/s TAPSE (M-mode): 2.4 cm RVSP:           8.3 mmHg  LEFT ATRIUM             Index        RIGHT ATRIUM           Index LA diam:        3.80 cm 1.83 cm/m   RA Pressure: 3.00 mmHg LA Vol (A2C):   47.5 ml 22.92 ml/m  RA Area:     13.20 cm LA Vol (A4C):   42.2 ml 20.37 ml/m  RA Volume:   31.30 ml  15.11 ml/m LA Biplane Vol: 45.7 ml 22.06 ml/m AORTIC VALVE LVOT Vmax:   96.40 cm/s LVOT Vmean:  65.500 cm/s LVOT VTI:    0.185 m  AORTA Ao Root diam: 3.00 cm Ao Asc diam:  2.80 cm  MITRAL VALVE                TRICUSPID VALVE TR Peak grad:   5.3 mmHg MV Decel Time: 173 msec     TR Vmax:        115.00 cm/s MV E velocity: 108.00 cm/s  Estimated RAP:  3.00 mmHg MV A velocity: 63.40 cm/s   RVSP:           8.3 mmHg MV E/A ratio:  1.70 SHUNTS Systemic VTI:  0.18 m Systemic Diam: 2.20 cm  Maudine Sos MD Electronically signed by Maudine Sos MD Signature Date/Time: 04/14/2021/5:27:26 PM    Final    MONITORS  LONG TERM MONITOR (3-14 DAYS) 08/26/2023  Narrative Patch Wear Time:  13 days and 19 hours (2025-04-25T21:45:50-0400 to 2025-05-09T16:49:00-0400)  Patient had a min HR of 49 bpm, max HR of 160 bpm, and avg HR of 88 bpm. Predominant underlying rhythm was Sinus Rhythm. Slight P wave morphology changes were noted.  9 Supraventricular Tachycardia runs occurred, the run with the fastest interval lasting 5 beats with a max rate of 146 bpm, the longest lasting 8 beats with an avg rate of 112 bpm. Isolated SVEs were rare (<1.0%), SVE Couplets were rare (<1.0%), and SVE Triplets were rare (<1.0%). Isolated VEs were rare (<1.0%), and no VE Couplets or VE Triplets were present.  Symptoms associated with premature atrial complexes.  Conclusion: This study showed evidence of paroxysmal supraventricular tachycardia which is  suspected to be atrial tachycardia with variable block and rare premature atrial complexes.       ______________________________________________________________________________________________     Recent Labs: 08/01/2023: B Natriuretic Peptide 67.4; BUN 13; Creatinine, Ser 0.83; Hemoglobin 14.2; Platelets 312; Potassium 3.7; Sodium 139  Recent Lipid Panel No results found for: CHOL, TRIG, HDL, CHOLHDL, VLDL, LDLCALC, LDLDIRECT  History of Present Illness    43 year old female with the above past medical history including palpitations, PACs, hypertension, prediabetes, Hashimoto's thyroiditis, left thyroid  nodule, and obesity.  Echocardiogram in 2023 showed EF 60 to 65%, normal LV function, no RWMA, normal RV systolic function, no significant valvular abnormalities. She was last seen in the office on 08/03/2023 and reported intermittent palpitations. BP was stable.  14-day ZIO monitor revealed predominantly sinus rhythm, 9 runs of SVT, longest episode lasting 8 beats, isolated SVE's and rare VE's.  She presents today for follow-up.  Since her last  visit she has been stable overall from a cardiac standpoint.  She continues to note intermittent palpitations, she will feel flushed, as well as a tightness in her neck.  She denies any chest pain, dyspnea, dizziness, presyncope, syncope, edema, PND, orthopnea, weight gain.  She has been under a significant amount of personal stress.  Her mother is in the ICU, awaiting possible LVAD.  Other than her ongoing palpitations, from a cardiac standpoint, she reports feeling well.  Home Medications    Current Outpatient Medications  Medication Sig Dispense Refill   BIOTIN PO Take 1 tablet by mouth 3 (three) times a week.     Calcium Citrate-Vitamin D  (CALCIUM CITRATE +D PO) Take 1 each by mouth 2 (two) times a week.     esomeprazole  (NEXIUM ) 40 MG capsule Take 1 capsule (40 mg total) by mouth daily. 90 capsule 1   fluticasone  (FLONASE ) 50  MCG/ACT nasal spray Administer 2 sprays into each nostril nightly. 16 g 5   Multiple Vitamins-Minerals (BARIATRIC FUSION PO) Take 1 tablet by mouth in the morning and at bedtime.     albuterol  (VENTOLIN  HFA) 108 (90 Base) MCG/ACT inhaler Inhale into the lungs every 6 (six) hours as needed for wheezing or shortness of breath. (Patient not taking: Reported on 09/21/2023)     albuterol  (VENTOLIN  HFA) 108 (90 Base) MCG/ACT inhaler Inhale 2 puffs into the lungs every 6 (six) hours as needed for wheezing or shortness of breath. (Patient not taking: Reported on 09/21/2023) 6.7 g 0   benzonatate  (TESSALON ) 100 MG capsule Take 1 capsule (100 mg total) by mouth 3 (three) times daily as needed. (Patient not taking: Reported on 09/21/2023) 30 capsule 0   brompheniramine-pseudoephedrine-DM 30-2-10 MG/5ML syrup Take 10 mLs by mouth every 6 (six) hours as needed for cough. (Patient not taking: Reported on 09/21/2023) 280 mL 0   famotidine  (PEPCID ) 20 MG tablet Take 1 tablet (20 mg total) by mouth 2 (two) times daily. 180 tablet 3   furosemide  (LASIX ) 20 MG tablet Take 1 tablet (20 mg total) by mouth once a week. (Patient not taking: Reported on 09/21/2023) 4 tablet 3   gabapentin (NEURONTIN) 300 MG capsule Take 300 mg by mouth at bedtime as needed (pain). (Patient not taking: Reported on 09/21/2023)     hyoscyamine  (OSCIMIN ) 0.125 MG tablet Take 1 tablet (0.125 mg total) by mouth every 6 (six) hours as needed for cramping. (Patient not taking: Reported on 09/21/2023) 30 tablet 0   ipratropium (ATROVENT ) 0.03 % nasal spray Place 2 sprays into both nostrils every 12 (twelve) hours. (Patient not taking: Reported on 09/21/2023) 30 mL 12   olmesartan  (BENICAR ) 20 MG tablet Take 0.5 tablets (10 mg total) by mouth daily. (Patient not taking: Reported on 09/21/2023) 90 tablet 1   potassium chloride  (KLOR-CON ) 10 MEQ tablet Take 1 tablet by mouth weekly with furosemide  dose. (Patient not taking: Reported on 09/21/2023) 4 tablet 3    predniSONE  (DELTASONE ) 10 MG tablet Take 4 tablets (40mg ) on days 1-4, then 3 tablets (30mg ) on days 5-8, then 2 tablets (20mg ) on days 9-11, then 1 tablet daily for days 12-14. Take with food. (Patient not taking: Reported on 09/21/2023) 37 tablet 0   progesterone  (PROMETRIUM ) 100 MG capsule Take 1-2 capsules (100-200 mg total) by mouth at bedtime. (Patient not taking: Reported on 09/21/2023) 60 capsule 1   Semaglutide -Weight Management (WEGOVY ) 1.7 MG/0.75ML SOAJ Inject 1.7 mg into the skin once a week. (Patient not taking: Reported on 09/21/2023) 3 mL  2   Semaglutide -Weight Management 2.4 MG/0.75ML SOAJ Inject 2.4 mg into the skin once a week. (Patient not taking: Reported on 09/21/2023) 3 mL 0   tirzepatide  (ZEPBOUND ) 2.5 MG/0.5ML Pen Inject 2.5 mg into the skin once a week. (Patient not taking: Reported on 09/21/2023) 2 mL 0   trimethoprim -polymyxin b  (POLYTRIM ) ophthalmic solution Instill 1-2 drops into affected eye four times daily x 5 days. (Patient not taking: Reported on 09/21/2023) 10 mL 0   Vitamin D , Ergocalciferol , (DRISDOL) 1.25 MG (50000 UNIT) CAPS capsule Take 50,000 Units by mouth every Tuesday. (Patient not taking: Reported on 09/21/2023)     No current facility-administered medications for this visit.     Review of Systems    She denies chest pain, dyspnea, pnd, orthopnea, n, v, dizziness, syncope, edema, weight gain, or early satiety. All other systems reviewed and are otherwise negative except as noted above.   Physical Exam    VS:  BP 128/72   Pulse 76   Ht 4' 11 (1.499 m)   Wt 270 lb (122.5 kg)   SpO2 99%   BMI 54.53 kg/m   GEN: Well nourished, well developed, in no acute distress. HEENT: normal. Neck: Supple, no JVD, carotid bruits, or masses. Cardiac: RRR, no murmurs, rubs, or gallops. No clubbing, cyanosis, edema.  Radials/DP/PT 2+ and equal bilaterally.  Respiratory:  Respirations regular and unlabored, clear to auscultation bilaterally. GI: Soft, nontender,  nondistended, BS + x 4. MS: no deformity or atrophy. Skin: warm and dry, no rash. Neuro:  Strength and sensation are intact. Psych: Normal affect.  Accessory Clinical Findings    ECG personally reviewed by me today -    - no EKG in office today.    Lab Results  Component Value Date   WBC 11.0 (H) 08/01/2023   HGB 14.2 08/01/2023   HCT 46.2 (H) 08/01/2023   MCV 92.4 08/01/2023   PLT 312 08/01/2023   Lab Results  Component Value Date   CREATININE 0.83 08/01/2023   BUN 13 08/01/2023   NA 139 08/01/2023   K 3.7 08/01/2023   CL 104 08/01/2023   CO2 22 08/01/2023   Lab Results  Component Value Date   ALT 12 08/21/2021   AST 13 08/21/2021   ALKPHOS 73 08/21/2021   BILITOT 0.5 08/21/2021   No results found for: CHOL, HDL, LDLCALC, LDLDIRECT, TRIG, CHOLHDL  Lab Results  Component Value Date   HGBA1C 6.1 (H) 03/26/2021    Assessment & Plan    1. Palpitations/PACs/PSVT: Echo in 2023 showed EF 60 to 65%, normal LV function, no RWMA, normal RV systolic function, no significant valvular abnormalities. 14-day ZIO monitor in 07/2023 revealed predominantly sinus rhythm, 9 runs of SVT, longest episode lasting 8 beats, isolated SVE's and rare VE's. She continues to note intermittent palpitations, concerning for SVT.  Has been under significant amount of personal stress.  Through shared decision making, will trial propranolol  10 mg three times daily as needed for palpitations. Reviewed ED precautions, vagal maneuvers.  2. Hypertension: BP well controlled. Continue current antihypertensive regimen.   3. Prediabetes/Obesity: No recent A1c on file.  A1c was 6.1 in 03/2021.  Continue lifestyle modifications with diet and exercise.  4. Disposition: Follow-up in 6 weeks.      Jude Norton, NP 09/21/2023, 4:50 PM

## 2023-09-22 ENCOUNTER — Other Ambulatory Visit (HOSPITAL_BASED_OUTPATIENT_CLINIC_OR_DEPARTMENT_OTHER): Payer: Self-pay

## 2023-09-23 ENCOUNTER — Encounter: Payer: Self-pay | Admitting: Nurse Practitioner

## 2023-10-28 ENCOUNTER — Encounter: Payer: Self-pay | Admitting: Advanced Practice Midwife

## 2023-11-03 ENCOUNTER — Other Ambulatory Visit (HOSPITAL_BASED_OUTPATIENT_CLINIC_OR_DEPARTMENT_OTHER): Payer: Self-pay

## 2023-11-03 MED ORDER — MELOXICAM 15 MG PO TABS
15.0000 mg | ORAL_TABLET | Freq: Every day | ORAL | 1 refills | Status: DC
  Filled 2023-11-03: qty 90, 90d supply, fill #0

## 2023-11-03 MED ORDER — DICLOFENAC SODIUM 1 % EX GEL
CUTANEOUS | 2 refills | Status: AC
  Filled 2023-11-03: qty 100, 7d supply, fill #0
  Filled 2024-01-06: qty 100, 7d supply, fill #1

## 2023-11-04 ENCOUNTER — Ambulatory Visit: Attending: Nurse Practitioner | Admitting: Nurse Practitioner

## 2023-11-04 NOTE — Progress Notes (Deleted)
 Office Visit    Patient Name: Kristina Khan Date of Encounter: 11/04/2023  Primary Care Provider:  Jolee Madelin Patch, MD Primary Cardiologist:  Dub Huntsman, DO  Chief Complaint    43 year old female with a history of palpitations, PACs, hypertension, prediabetes, Hashimoto's thyroiditis, left thyroid  nodule, and obesity who presents for follow-up related to palpitations.   Past Medical History    Past Medical History:  Diagnosis Date   GERD (gastroesophageal reflux disease)    Hashimoto's thyroiditis    Obesity    Prediabetes    Past Surgical History:  Procedure Laterality Date   BIOPSY  09/28/2021   Procedure: BIOPSY;  Surgeon: Federico Rosario BROCKS, MD;  Location: [facility] ENDOSCOPY;  Service: Gastroenterology;;   CHOLECYSTECTOMY  2009   ESOPHAGOGASTRODUODENOSCOPY (EGD) WITH PROPOFOL  N/A 09/28/2021   Procedure: ESOPHAGOGASTRODUODENOSCOPY (EGD) WITH PROPOFOL ;  Surgeon: Federico Rosario BROCKS, MD;  Location: [facility] ENDOSCOPY;  Service: Gastroenterology;  Laterality: N/A;   HIATAL HERNIA REPAIR  2017   LAPAROSCOPIC GASTRIC SLEEVE RESECTION  2017   POLYPECTOMY  09/28/2021   Procedure: POLYPECTOMY;  Surgeon: Federico Rosario BROCKS, MD;  Location: [facility] ENDOSCOPY;  Service: Gastroenterology;;   TONSILLECTOMY  2012    Allergies  Allergies  Allergen Reactions   Shellfish Allergy Anaphylaxis   Penicillins Rash     Labs/Other Studies Reviewed    The following studies were reviewed today:  Cardiac Studies & Procedures   ______________________________________________________________________________________________     ECHOCARDIOGRAM  ECHOCARDIOGRAM COMPLETE 04/14/2021  Narrative ECHOCARDIOGRAM REPORT    Patient Name:   Kristina Khan Date of Exam: 04/14/2021 Medical Rec #:  [id number]          Height:       59.0 in Accession #:    [id number]         Weight:       263.0 lb Date of Birth:  04-23-1980         BSA:          2.072 m Patient Age:    40 years           BP:           128/84  mmHg Patient Gender: F                  HR:           91 bpm. Exam Location:  [address]  Procedure: 2D Echo, 3D Echo, Cardiac Doppler, Color Doppler and Strain Analysis  Indications:    R06.02 Shortness of breath  History:        Patient has no prior history of Echocardiogram examinations.  Sonographer:    Marshia Lawyer BS, RDCS Referring Phys: [id number] KARDIE TOBB  IMPRESSIONS   1. Left ventricular ejection fraction, by estimation, is 60 to 65%. Left ventricular ejection fraction by PLAX is 61 %. The left ventricle has normal function. The left ventricle has no regional wall motion abnormalities. Left ventricular diastolic parameters were normal. The average left ventricular global longitudinal strain is -25.2 %. The global longitudinal strain is normal. 2. Right ventricular systolic function is normal. The right ventricular size is normal. There is normal pulmonary artery systolic pressure. 3. The mitral valve is normal in structure. No evidence of mitral valve regurgitation. No evidence of mitral stenosis. 4. The aortic valve is tricuspid. Aortic valve regurgitation is not visualized. No aortic stenosis is present. 5. The inferior vena cava is normal in size with greater than 50% respiratory variability, suggesting right atrial  pressure of 3 mmHg.  FINDINGS Left Ventricle: Left ventricular ejection fraction, by estimation, is 60 to 65%. Left ventricular ejection fraction by PLAX is 61 %. The left ventricle has normal function. The left ventricle has no regional wall motion abnormalities. The average left ventricular global longitudinal strain is -25.2 %. The global longitudinal strain is normal. The left ventricular internal cavity size was normal in size. There is no left ventricular hypertrophy. Left ventricular diastolic parameters were normal. Indeterminate filling pressures.  Right Ventricle: The right ventricular size is normal. No increase in right ventricular wall thickness.  Right ventricular systolic function is normal. There is normal pulmonary artery systolic pressure. The tricuspid regurgitant velocity is 1.15 m/s, and with an assumed right atrial pressure of 3 mmHg, the estimated right ventricular systolic pressure is 8.3 mmHg.  Left Atrium: Left atrial size was normal in size.  Right Atrium: Right atrial size was normal in size.  Pericardium: There is no evidence of pericardial effusion.  Mitral Valve: The mitral valve is normal in structure. No evidence of mitral valve regurgitation. No evidence of mitral valve stenosis.  Tricuspid Valve: The tricuspid valve is normal in structure. Tricuspid valve regurgitation is trivial. No evidence of tricuspid stenosis.  Aortic Valve: The aortic valve is tricuspid. Aortic valve regurgitation is not visualized. No aortic stenosis is present.  Pulmonic Valve: The pulmonic valve was normal in structure. Pulmonic valve regurgitation is not visualized. No evidence of pulmonic stenosis.  Aorta: The aortic root is normal in size and structure.  Venous: The inferior vena cava is normal in size with greater than 50% respiratory variability, suggesting right atrial pressure of 3 mmHg.  IAS/Shunts: No atrial level shunt detected by color flow Doppler.   LEFT VENTRICLE PLAX 2D LV EF:         Left            Diastology ventricular     LV e' medial:    10.90 cm/s ejection        LV E/e' medial:  9.9 fraction by     LV e' lateral:   17.80 cm/s PLAX is 61      LV E/e' lateral: 6.1 %. LVIDd:         4.00 cm         2D LVIDs:         2.70 cm         Longitudinal LV PW:         1.00 cm         Strain LV IVS:        0.90 cm         2D Strain GLS  -29.4 % LVOT diam:     2.20 cm         (A2C): LV SV:         70              2D Strain GLS  -25.0 % LV SV Index:   34              (A3C): LVOT Area:     3.80 cm        2D Strain GLS  -21.0 % (A4C): 2D Strain GLS  -25.2 % Avg:  3D Volume EF: 3D EF:        62 % LV EDV:        109 ml LV ESV:       41 ml LV SV:  68 ml  RIGHT VENTRICLE             IVC RV Basal diam:  3.20 cm     IVC diam: 1.90 cm RV S prime:     16.40 cm/s TAPSE (M-mode): 2.4 cm RVSP:           8.3 mmHg  LEFT ATRIUM             Index        RIGHT ATRIUM           Index LA diam:        3.80 cm 1.83 cm/m   RA Pressure: 3.00 mmHg LA Vol (A2C):   47.5 ml 22.92 ml/m  RA Area:     13.20 cm LA Vol (A4C):   42.2 ml 20.37 ml/m  RA Volume:   31.30 ml  15.11 ml/m LA Biplane Vol: 45.7 ml 22.06 ml/m AORTIC VALVE LVOT Vmax:   96.40 cm/s LVOT Vmean:  65.500 cm/s LVOT VTI:    0.185 m  AORTA Ao Root diam: 3.00 cm Ao Asc diam:  2.80 cm  MITRAL VALVE                TRICUSPID VALVE TR Peak grad:   5.3 mmHg MV Decel Time: 173 msec     TR Vmax:        115.00 cm/s MV E velocity: 108.00 cm/s  Estimated RAP:  3.00 mmHg MV A velocity: 63.40 cm/s   RVSP:           8.3 mmHg MV E/A ratio:  1.70 SHUNTS Systemic VTI:  0.18 m Systemic Diam: 2.20 cm  Annabella Scarce MD Electronically signed by Annabella Scarce MD Signature Date/Time: 04/14/2021/5:27:26 PM    Final    MONITORS  LONG TERM MONITOR (3-14 DAYS) 08/26/2023  Narrative Patch Wear Time:  13 days and 19 hours (2025-04-25T21:45:50-0400 to 2025-05-09T16:49:00-0400)  Patient had a min HR of 49 bpm, max HR of 160 bpm, and avg HR of 88 bpm. Predominant underlying rhythm was Sinus Rhythm. Slight P wave morphology changes were noted.  9 Supraventricular Tachycardia runs occurred, the run with the fastest interval lasting 5 beats with a max rate of 146 bpm, the longest lasting 8 beats with an avg rate of 112 bpm. Isolated SVEs were rare (<1.0%), SVE Couplets were rare (<1.0%), and SVE Triplets were rare (<1.0%). Isolated VEs were rare (<1.0%), and no VE Couplets or VE Triplets were present.  Symptoms associated with premature atrial complexes.  Conclusion: This study showed evidence of paroxysmal supraventricular tachycardia which is  suspected to be atrial tachycardia with variable block and rare premature atrial complexes.       ______________________________________________________________________________________________     Recent Labs: 08/01/2023: B Natriuretic Peptide 67.4; BUN 13; Creatinine, Ser 0.83; Hemoglobin 14.2; Platelets 312; Potassium 3.7; Sodium 139  Recent Lipid Panel No results found for: CHOL, TRIG, HDL, CHOLHDL, VLDL, LDLCALC, LDLDIRECT  History of Present Illness    43 year old female with the above past medical history including palpitations, PACs, hypertension, prediabetes, Hashimoto's thyroiditis, left thyroid  nodule, and obesity.   Echocardiogram in 2023 showed EF 60 to 65%, normal LV function, no RWMA, normal RV systolic function, no significant valvular abnormalities.  14-day ZIO monitor in 07/2023 revealed predominantly sinus rhythm, 9 runs of SVT, longest episode lasting 8 beats, isolated SVE's and rare VE's. She was last seen in the office on 09/21/2023 and reported intermittent palpitations.  He noted a significant amount of personal stress.  She was started on propranolol  10 mg 3 times daily as needed for palpitations.   She presents today for follow-up.  Since her last visit she has   1. Palpitations/PACs/PSVT: Echo in 2023 showed EF 60 to 65%, normal LV function, no RWMA, normal RV systolic function, no significant valvular abnormalities. 14-day ZIO monitor in 07/2023 revealed predominantly sinus rhythm, 9 runs of SVT, longest episode lasting 8 beats, isolated SVE's and rare VE's. She continues to note intermittent palpitations, concerning for SVT.  Has been under significant amount of personal stress.  Through shared decision making, will trial propranolol  10 mg three times daily as needed for palpitations. Reviewed ED precautions, vagal maneuvers.   2. Hypertension: BP well controlled. Continue current antihypertensive regimen.    3. Prediabetes/Obesity: No recent A1c on file.   A1c was 6.1 in 03/2021.  Continue lifestyle modifications with diet and exercise.   4. Disposition: Follow-up in  Home Medications    Current Outpatient Medications  Medication Sig Dispense Refill   albuterol  (VENTOLIN  HFA) 108 (90 Base) MCG/ACT inhaler Inhale into the lungs every 6 (six) hours as needed for wheezing or shortness of breath. (Patient not taking: Reported on 09/21/2023)     albuterol  (VENTOLIN  HFA) 108 (90 Base) MCG/ACT inhaler Inhale 2 puffs into the lungs every 6 (six) hours as needed for wheezing or shortness of breath. (Patient not taking: Reported on 09/21/2023) 6.7 g 0   benzonatate  (TESSALON ) 100 MG capsule Take 1 capsule (100 mg total) by mouth 3 (three) times daily as needed. (Patient not taking: Reported on 09/21/2023) 30 capsule 0   BIOTIN PO Take 1 tablet by mouth 3 (three) times a week.     brompheniramine-pseudoephedrine-DM 30-2-10 MG/5ML syrup Take 10 mLs by mouth every 6 (six) hours as needed for cough. (Patient not taking: Reported on 09/21/2023) 280 mL 0   Calcium Citrate-Vitamin D  (CALCIUM CITRATE +D PO) Take 1 each by mouth 2 (two) times a week.     diclofenac  Sodium (VOLTAREN ) 1 % GEL Apply 1 g topically 4 (four) times a day. 100 g 2   esomeprazole  (NEXIUM ) 40 MG capsule Take 1 capsule (40 mg total) by mouth daily. 90 capsule 1   famotidine  (PEPCID ) 20 MG tablet Take 1 tablet (20 mg total) by mouth 2 (two) times daily. 180 tablet 3   fluticasone  (FLONASE ) 50 MCG/ACT nasal spray Administer 2 sprays into each nostril nightly. 16 g 5   furosemide  (LASIX ) 20 MG tablet Take 1 tablet (20 mg total) by mouth once a week. (Patient not taking: Reported on 09/21/2023) 4 tablet 3   gabapentin (NEURONTIN) 300 MG capsule Take 300 mg by mouth at bedtime as needed (pain). (Patient not taking: Reported on 09/21/2023)     hyoscyamine  (OSCIMIN ) 0.125 MG tablet Take 1 tablet (0.125 mg total) by mouth every 6 (six) hours as needed for cramping. (Patient not taking: Reported on  09/21/2023) 30 tablet 0   ipratropium (ATROVENT ) 0.03 % nasal spray Place 2 sprays into both nostrils every 12 (twelve) hours. (Patient not taking: Reported on 09/21/2023) 30 mL 12   meloxicam  (MOBIC ) 15 MG tablet Take 1 tablet (15 mg total) by mouth daily. 90 tablet 1   Multiple Vitamins-Minerals (BARIATRIC FUSION PO) Take 1 tablet by mouth in the morning and at bedtime.     olmesartan  (BENICAR ) 20 MG tablet Take 0.5 tablets (10 mg total) by mouth daily. (Patient not taking: Reported on 09/21/2023) 90 tablet 1   potassium chloride  (KLOR-CON ) 10 MEQ  tablet Take 1 tablet by mouth weekly with furosemide  dose. (Patient not taking: Reported on 09/21/2023) 4 tablet 3   predniSONE  (DELTASONE ) 10 MG tablet Take 4 tablets (40mg ) on days 1-4, then 3 tablets (30mg ) on days 5-8, then 2 tablets (20mg ) on days 9-11, then 1 tablet daily for days 12-14. Take with food. (Patient not taking: Reported on 09/21/2023) 37 tablet 0   progesterone  (PROMETRIUM ) 100 MG capsule Take 1-2 capsules (100-200 mg total) by mouth at bedtime. (Patient not taking: Reported on 09/21/2023) 60 capsule 1   propranolol  (INDERAL ) 10 MG tablet Take 1 tablet (10 mg total) by mouth 3 (three) times daily as needed. 90 tablet 3   Semaglutide -Weight Management (WEGOVY ) 1.7 MG/0.75ML SOAJ Inject 1.7 mg into the skin once a week. (Patient not taking: Reported on 09/21/2023) 3 mL 2   Semaglutide -Weight Management 2.4 MG/0.75ML SOAJ Inject 2.4 mg into the skin once a week. (Patient not taking: Reported on 09/21/2023) 3 mL 0   trimethoprim -polymyxin b  (POLYTRIM ) ophthalmic solution Instill 1-2 drops into affected eye four times daily x 5 days. (Patient not taking: Reported on 09/21/2023) 10 mL 0   Vitamin D , Ergocalciferol , (DRISDOL) 1.25 MG (50000 UNIT) CAPS capsule Take 50,000 Units by mouth every Tuesday. (Patient not taking: Reported on 09/21/2023)     No current facility-administered medications for this visit.     Review of Systems    ***.  All other  systems reviewed and are otherwise negative except as noted above.    Physical Exam    VS:  There were no vitals taken for this visit. , BMI There is no height or weight on file to calculate BMI.     GEN: Well nourished, well developed, in no acute distress. HEENT: normal. Neck: Supple, no JVD, carotid bruits, or masses. Cardiac: RRR, no murmurs, rubs, or gallops. No clubbing, cyanosis, edema.  Radials/DP/PT 2+ and equal bilaterally.  Respiratory:  Respirations regular and unlabored, clear to auscultation bilaterally. GI: Soft, nontender, nondistended, BS + x 4. MS: no deformity or atrophy. Skin: warm and dry, no rash. Neuro:  Strength and sensation are intact. Psych: Normal affect.  Accessory Clinical Findings    ECG personally reviewed by me today -    - no acute changes.   Lab Results  Component Value Date   WBC 11.0 (H) 08/01/2023   HGB 14.2 08/01/2023   HCT 46.2 (H) 08/01/2023   MCV 92.4 08/01/2023   PLT 312 08/01/2023   Lab Results  Component Value Date   CREATININE 0.83 08/01/2023   BUN 13 08/01/2023   NA 139 08/01/2023   K 3.7 08/01/2023   CL 104 08/01/2023   CO2 22 08/01/2023   Lab Results  Component Value Date   ALT 12 08/21/2021   AST 13 08/21/2021   ALKPHOS 73 08/21/2021   BILITOT 0.5 08/21/2021   No results found for: CHOL, HDL, LDLCALC, LDLDIRECT, TRIG, CHOLHDL  Lab Results  Component Value Date   HGBA1C 6.1 (H) 03/26/2021    Assessment & Plan    1.  ***  No BP recorded.  {Refresh Note OR Click here to enter BP  :1}***   Damien JAYSON Braver, NP 11/04/2023, 7:08 AM

## 2023-11-17 ENCOUNTER — Other Ambulatory Visit (HOSPITAL_BASED_OUTPATIENT_CLINIC_OR_DEPARTMENT_OTHER): Payer: Self-pay

## 2023-12-13 ENCOUNTER — Other Ambulatory Visit (HOSPITAL_BASED_OUTPATIENT_CLINIC_OR_DEPARTMENT_OTHER): Payer: Self-pay

## 2024-01-05 ENCOUNTER — Other Ambulatory Visit (HOSPITAL_BASED_OUTPATIENT_CLINIC_OR_DEPARTMENT_OTHER): Payer: Self-pay

## 2024-01-05 MED ORDER — FLUTICASONE PROPIONATE 50 MCG/ACT NA SUSP
2.0000 | Freq: Every evening | NASAL | 5 refills | Status: AC
  Filled 2024-01-05: qty 16, 30d supply, fill #0
  Filled 2024-03-15: qty 16, 30d supply, fill #1

## 2024-01-05 MED ORDER — WEGOVY 0.25 MG/0.5ML ~~LOC~~ SOAJ
0.2500 mg | SUBCUTANEOUS | 0 refills | Status: AC
  Filled 2024-01-05: qty 2, 28d supply, fill #0

## 2024-01-06 ENCOUNTER — Other Ambulatory Visit (HOSPITAL_BASED_OUTPATIENT_CLINIC_OR_DEPARTMENT_OTHER): Payer: Self-pay

## 2024-01-06 MED ORDER — ESOMEPRAZOLE MAGNESIUM 40 MG PO CPDR
40.0000 mg | DELAYED_RELEASE_CAPSULE | Freq: Every day | ORAL | 1 refills | Status: AC
  Filled 2024-01-06 – 2024-03-15 (×3): qty 90, 90d supply, fill #0

## 2024-01-08 ENCOUNTER — Other Ambulatory Visit (HOSPITAL_BASED_OUTPATIENT_CLINIC_OR_DEPARTMENT_OTHER): Payer: Self-pay

## 2024-01-09 ENCOUNTER — Other Ambulatory Visit (HOSPITAL_BASED_OUTPATIENT_CLINIC_OR_DEPARTMENT_OTHER): Payer: Self-pay

## 2024-03-15 ENCOUNTER — Other Ambulatory Visit (HOSPITAL_BASED_OUTPATIENT_CLINIC_OR_DEPARTMENT_OTHER): Payer: Self-pay

## 2024-03-26 ENCOUNTER — Other Ambulatory Visit (HOSPITAL_BASED_OUTPATIENT_CLINIC_OR_DEPARTMENT_OTHER): Payer: Self-pay
